# Patient Record
Sex: Female | Born: 1995 | Race: Asian | Hispanic: No | Marital: Single | Smoking: Never smoker
Health system: Southern US, Community
[De-identification: ages and names within clinical notes are randomized; demographics above are authoritative.]

## PROBLEM LIST (undated history)

## (undated) DIAGNOSIS — F32A Depression, unspecified: Secondary | ICD-10-CM

## (undated) DIAGNOSIS — K589 Irritable bowel syndrome without diarrhea: Secondary | ICD-10-CM

## (undated) DIAGNOSIS — F419 Anxiety disorder, unspecified: Secondary | ICD-10-CM

## (undated) HISTORY — PX: NO PAST SURGERIES: SHX2092

## (undated) HISTORY — PX: UPPER GI ENDOSCOPY: SHX6162

## (undated) HISTORY — DX: Depression, unspecified: F32.A

## (undated) HISTORY — PX: COLONOSCOPY: SHX174

## (undated) HISTORY — DX: Anxiety disorder, unspecified: F41.9

## (undated) HISTORY — DX: Irritable bowel syndrome, unspecified: K58.9

---

## 2020-10-11 ENCOUNTER — Encounter: Payer: Self-pay | Admitting: Medical-Surgical

## 2020-10-11 ENCOUNTER — Ambulatory Visit (INDEPENDENT_AMBULATORY_CARE_PROVIDER_SITE_OTHER): Payer: BC Managed Care – PPO | Admitting: Medical-Surgical

## 2020-10-11 ENCOUNTER — Other Ambulatory Visit: Payer: Self-pay

## 2020-10-11 VITALS — BP 116/74 | HR 76 | Temp 98.6°F | Ht 64.0 in | Wt 140.9 lb

## 2020-10-11 DIAGNOSIS — Z113 Encounter for screening for infections with a predominantly sexual mode of transmission: Secondary | ICD-10-CM

## 2020-10-11 DIAGNOSIS — Z1159 Encounter for screening for other viral diseases: Secondary | ICD-10-CM

## 2020-10-11 DIAGNOSIS — F418 Other specified anxiety disorders: Secondary | ICD-10-CM

## 2020-10-11 DIAGNOSIS — Z7689 Persons encountering health services in other specified circumstances: Secondary | ICD-10-CM | POA: Diagnosis not present

## 2020-10-11 DIAGNOSIS — Z114 Encounter for screening for human immunodeficiency virus [HIV]: Secondary | ICD-10-CM

## 2020-10-11 DIAGNOSIS — Z30011 Encounter for initial prescription of contraceptive pills: Secondary | ICD-10-CM

## 2020-10-11 LAB — POCT URINE PREGNANCY: Preg Test, Ur: NEGATIVE

## 2020-10-11 MED ORDER — NORETHINDRONE 0.35 MG PO TABS
1.0000 | ORAL_TABLET | Freq: Every day | ORAL | 11 refills | Status: DC
Start: 2020-10-11 — End: 2021-09-05

## 2020-10-11 NOTE — Progress Notes (Signed)
New Patient Office Visit  Subjective:  Patient ID: Heather Wolfe, female    DOB: 1996/02/01  Age: 25 y.o. MRN: [id number]  CC:  Chief Complaint  Patient presents with  . Establish Care     HPI Heather Wolfe presents to establish care.   Anxiety/Depression- previously treated but not able to remember what medications she took. Notes that there was 3 of them and thinks that was just too many at one time. Predominantly anxiety symptoms, notes triggers are usually small things but cannot provide specific examples. Not wanting to restart medications today but would like a referral to counseling as she felt this was useful in the past.   Birth control- currently sexually active with one female partner, using condoms. Would like to restart birth control but interested in COCs. 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 Has not had migraines in several years now and would like to go back on COCs, mainly for the beneficial action on her acne.   Requesting STI screening today. No known exposure but would like to be tested for peace of mind.   Past Medical History:  Diagnosis Date  . Anxiety   . Depression     Past Surgical History:  Procedure Laterality Date  . NO PAST SURGERIES      Family History  Problem Relation Age of Onset  . Diabetes Mother   . Hypertension Mother   . Breast cancer Paternal Aunt   . Diabetes Maternal Grandfather   . Diabetes Paternal Grandmother     Social History   Socioeconomic History  . Marital status: Single    Spouse name: Not on file  . Number of children: Not on file  . Years of education: Not on file  . Highest education level: Not on file  Occupational History  . Not on file  Tobacco Use  . Smoking status: Never Smoker  . Smokeless tobacco: Never Used  Vaping Use  . Vaping Use: Never used  Substance and Sexual Activity  . Alcohol use: Yes    Alcohol/week: 1.0 - 2.0 standard drink     Types: 1 - 2 Standard drinks or equivalent per week  . Drug use: Never  . Sexual activity: Yes    Partners: Male    Birth control/protection: Condom  Other Topics Concern  . Not on file  Social History Narrative  . Not on file   Social Determinants of Health   Financial Resource Strain: Not on file  Food Insecurity: Not on file  Transportation Needs: Not on file  Physical Activity: Not on file  Stress: Not on file  Social Connections: Not on file  Intimate Partner Violence: Not on file    ROS Review of Systems  Constitutional: Negative for chills, fatigue, fever and unexpected weight change.  Respiratory: Negative for cough, chest tightness, shortness of breath and wheezing.   Cardiovascular: Negative for chest pain, palpitations and leg swelling.  Genitourinary: Negative for dysuria, frequency and urgency.  Neurological: Negative for dizziness, light-headedness and headaches.  Psychiatric/Behavioral: Positive for dysphoric mood. Negative for self-injury and suicidal ideas. The patient is nervous/anxious.     Objective:   Today's Vitals: BP 116/74   Pulse 76   Temp 98.6 F (37 C)   Ht 5\' 4"  (1.626 m)   Wt 140 lb 14.4 oz (63.9 kg)   LMP 09/23/2020   BMI 24.19 kg/m   Physical Exam Vitals reviewed.  Constitutional:  General: She is not in acute distress.    Appearance: Normal appearance.  HENT:     Head: Normocephalic and atraumatic.  Cardiovascular:     Rate and Rhythm: Normal rate and regular rhythm.     Pulses: Normal pulses.     Heart sounds: Normal heart sounds. No murmur heard. No friction rub. No gallop.   Pulmonary:     Effort: Pulmonary effort is normal. No respiratory distress.     Breath sounds: Normal breath sounds. No wheezing.  Skin:    General: Skin is warm and dry.  Neurological:     Mental Status: She is alert and oriented to person, place, and time.  Psychiatric:        Mood and Affect: Mood normal.        Behavior: Behavior normal.         Thought Content: Thought content normal.        Judgment: Judgment normal.    Assessment & Plan:   1. Encounter to establish care Reviewed available information and discussed concerns with patient.   2. Anxiety with depression Discussed management of anxiety/depression. Advised to return for discussion if she would like to start a medication. We will avoid benzodiazepines but if she should warrant such treatment, will refer to psychiatry. Referring to behavioral health for counseling.  - Ambulatory referral to Behavioral Health  3. Routine screening for STI (sexually transmitted infection) Checking for STIs as below.  - RPR - Hepatitis B surface antigen - C. trachomatis/N. gonorrhoeae RNA  4. Screening for HIV (human immunodeficiency virus) Checking for HIV for routine STI screening.  - HIV Antibody (routine testing w rflx)  5. Need for hepatitis C screening test Checking for hepatitis C for routine STI screening.  - Hepatitis C antibody  6. Encounter for prescription of oral contraceptives POCT UPT negative. Discussed the risks of COCs in women with migraines with aura. Discussed progesterone only options. She would like to restart the mini pill. Sending in Micronor.  - POCT urine pregnancy  Outpatient Encounter Medications as of 10/11/2020  Medication Sig  . norethindrone (ORTHO MICRONOR) 0.35 MG tablet Take 1 tablet (0.35 mg total) by mouth daily.   No facility-administered encounter medications on file as of 10/11/2020.    Follow-up: Return in about 6 weeks (around 11/22/2020) for birth control follow up.   Thayer Ohm, DNP, APRN, FNP-BC [facility]

## 2020-10-11 NOTE — Patient Instructions (Signed)
Critical care medicine: Principles of diagnosis and management in the adult (4th ed., pp. 0350-0938). Saunders."> Miller's anesthesia (8th ed., pp. 232-250). Saunders.">  Advance Directive  Advance directives are legal documents that allow you to make decisions about your health care and medical treatment in case you become unable to communicate for yourself. Advance directives let your wishes be known to family, friends, and health care providers. Discussing and writing advance directives should happen over time rather than all at once. Advance directives can be changed and updated at any time. There are different types of advance directives, such as:  Medical power of attorney.  Living will.  Do not resuscitate (DNR) order or do not attempt resuscitation (DNAR) order. Health care proxy and medical power of attorney A health care proxy is also called a health care agent. This person is appointed to make medical decisions for you when you are unable to make decisions for yourself. Generally, people ask a trusted friend or family member to act as their proxy and represent their preferences. Make sure you have an agreement with your trusted person to act as your proxy. A proxy may have to make a medical decision on your behalf if your wishes are not known. A medical power of attorney, also called a durable power of attorney for health care, is a legal document that names your health care proxy. Depending on the laws in your state, the document may need to be:  Signed.  Notarized.  Dated.  Copied.  Witnessed.  Incorporated into your medical record. You may also want to appoint a trusted person to manage your money in the event you are unable to do so. This is called a durable power of attorney for finances. It is a separate legal document from the durable power of attorney for health care. You may choose your health care proxy or someone different to act as your agent in money matters. If you  do not appoint a proxy, or there is a concern that the proxy is not acting in your best interest, a court may appoint a guardian to act on your behalf. Living will A living will is a set of instructions that state your wishes about medical care when you cannot express them yourself. Health care providers should keep a copy of your living will in your medical record. You may want to give a copy to family members or friends. To alert caregivers in case of an emergency, you can place a card in your wallet to let them know that you have a living will and where they can find it. A living will is used if you become:  Terminally ill.  Disabled.  Unable to communicate or make decisions. The following decisions should be included in your living will:  To use or not to use life support equipment, such as dialysis machines and breathing machines (ventilators).  Whether you want a DNR or DNAR order. This tells health care providers not to use cardiopulmonary resuscitation (CPR) if breathing or heartbeat stops.  To use or not to use tube feeding.  To be given or not to be given food and fluids.  Whether you want comfort (palliative) care when the goal becomes comfort rather than a cure.  Whether you want to donate your organs and tissues. A living will does not give instructions for distributing your money and property if you should pass away. DNR or DNAR A DNR or DNAR order is a request not to have CPR in  the event that your heart stops beating or you stop breathing. If a DNR or DNAR order has not been made and shared, a health care provider will try to help any patient whose heart has stopped or who has stopped breathing. If you plan to have surgery, talk with your health care provider about how your DNR or DNAR order will be followed if problems occur. What if I do not have an advance directive? Some states assign family decision makers to act on your behalf if you do not have an advance directive.  Each state has its own laws about advance directives. You may want to check with your health care provider, attorney, or state representative about the laws in your state. Summary  Advance directives are legal documents that allow you to make decisions about your health care and medical treatment in case you become unable to communicate for yourself.  The process of discussing and writing advance directives should happen over time. You can change and update advance directives at any time.  Advance directives may include a medical power of attorney, a living will, and a DNR or DNAR order. This information is not intended to replace advice given to you by your health care provider. Make sure you discuss any questions you have with your health care provider. Document Revised: 06/18/2020 Document Reviewed: 06/18/2020 Elsevier Patient Education  2021 Elsevier Inc.   Critical care medicine: Principles of diagnosis and management in the adult (4th ed., pp. 9767-3419). Saunders."> Miller's anesthesia (8th ed., pp. 232-250). Saunders.">  Advance Directive  Advance directives are legal documents that allow you to make decisions about your health care and medical treatment in case you become unable to communicate for yourself. Advance directives let your wishes be known to family, friends, and health care providers. Discussing and writing advance directives should happen over time rather than all at once. Advance directives can be changed and updated at any time. There are different types of advance directives, such as:  Medical power of attorney.  Living will.  Do not resuscitate (DNR) order or do not attempt resuscitation (DNAR) order. Health care proxy and medical power of attorney A health care proxy is also called a health care agent. This person is appointed to make medical decisions for you when you are unable to make decisions for yourself. Generally, people ask a trusted friend or family  member to act as their proxy and represent their preferences. Make sure you have an agreement with your trusted person to act as your proxy. A proxy may have to make a medical decision on your behalf if your wishes are not known. A medical power of attorney, also called a durable power of attorney for health care, is a legal document that names your health care proxy. Depending on the laws in your state, the document may need to be:  Signed.  Notarized.  Dated.  Copied.  Witnessed.  Incorporated into your medical record. You may also want to appoint a trusted person to manage your money in the event you are unable to do so. This is called a durable power of attorney for finances. It is a separate legal document from the durable power of attorney for health care. You may choose your health care proxy or someone different to act as your agent in money matters. If you do not appoint a proxy, or there is a concern that the proxy is not acting in your best interest, a court may appoint a guardian to  act on your behalf. Living will A living will is a set of instructions that state your wishes about medical care when you cannot express them yourself. Health care providers should keep a copy of your living will in your medical record. You may want to give a copy to family members or friends. To alert caregivers in case of an emergency, you can place a card in your wallet to let them know that you have a living will and where they can find it. A living will is used if you become:  Terminally ill.  Disabled.  Unable to communicate or make decisions. The following decisions should be included in your living will:  To use or not to use life support equipment, such as dialysis machines and breathing machines (ventilators).  Whether you want a DNR or DNAR order. This tells health care providers not to use cardiopulmonary resuscitation (CPR) if breathing or heartbeat stops.  To use or not to use tube  feeding.  To be given or not to be given food and fluids.  Whether you want comfort (palliative) care when the goal becomes comfort rather than a cure.  Whether you want to donate your organs and tissues. A living will does not give instructions for distributing your money and property if you should pass away. DNR or DNAR A DNR or DNAR order is a request not to have CPR in the event that your heart stops beating or you stop breathing. If a DNR or DNAR order has not been made and shared, a health care provider will try to help any patient whose heart has stopped or who has stopped breathing. If you plan to have surgery, talk with your health care provider about how your DNR or DNAR order will be followed if problems occur. What if I do not have an advance directive? Some states assign family decision makers to act on your behalf if you do not have an advance directive. Each state has its own laws about advance directives. You may want to check with your health care provider, attorney, or state representative about the laws in your state. Summary  Advance directives are legal documents that allow you to make decisions about your health care and medical treatment in case you become unable to communicate for yourself.  The process of discussing and writing advance directives should happen over time. You can change and update advance directives at any time.  Advance directives may include a medical power of attorney, a living will, and a DNR or DNAR order. This information is not intended to replace advice given to you by your health care provider. Make sure you discuss any questions you have with your health care provider. Document Revised: 06/18/2020 Document Reviewed: 06/18/2020 Elsevier Patient Education  2021 [employer].

## 2020-10-12 LAB — C. TRACHOMATIS/N. GONORRHOEAE RNA
C. trachomatis RNA, TMA: NOT DETECTED
N. gonorrhoeae RNA, TMA: NOT DETECTED

## 2020-10-15 LAB — HEPATITIS C ANTIBODY
Hepatitis C Ab: NONREACTIVE
SIGNAL TO CUT-OFF: 0.01 (ref <1.00)

## 2020-10-15 LAB — HEPATITIS B SURFACE ANTIGEN: Hepatitis B Surface Ag: NONREACTIVE

## 2020-10-15 LAB — RPR: RPR Ser Ql: NONREACTIVE

## 2020-10-15 LAB — HIV ANTIBODY (ROUTINE TESTING W REFLEX): HIV 1&2 Ab, 4th Generation: NONREACTIVE

## 2020-10-18 ENCOUNTER — Ambulatory Visit (INDEPENDENT_AMBULATORY_CARE_PROVIDER_SITE_OTHER): Payer: BC Managed Care – PPO | Admitting: Psychology

## 2020-10-18 DIAGNOSIS — F411 Generalized anxiety disorder: Secondary | ICD-10-CM | POA: Diagnosis not present

## 2020-10-31 ENCOUNTER — Ambulatory Visit: Payer: BC Managed Care – PPO | Admitting: Psychology

## 2020-11-15 ENCOUNTER — Ambulatory Visit (INDEPENDENT_AMBULATORY_CARE_PROVIDER_SITE_OTHER): Payer: BC Managed Care – PPO | Admitting: Psychology

## 2020-11-15 DIAGNOSIS — F411 Generalized anxiety disorder: Secondary | ICD-10-CM

## 2020-11-22 ENCOUNTER — Encounter: Payer: Self-pay | Admitting: Medical-Surgical

## 2020-11-22 ENCOUNTER — Telehealth (INDEPENDENT_AMBULATORY_CARE_PROVIDER_SITE_OTHER): Payer: BC Managed Care – PPO | Admitting: Medical-Surgical

## 2020-11-22 DIAGNOSIS — Z789 Other specified health status: Secondary | ICD-10-CM | POA: Diagnosis not present

## 2020-11-22 DIAGNOSIS — Z30011 Encounter for initial prescription of contraceptive pills: Secondary | ICD-10-CM

## 2020-11-22 NOTE — Progress Notes (Signed)
Virtual Visit via Video Note  I connected with Heather Wolfe on 11/22/20 at  8:30 AM EST by a video enabled telemedicine application and verified that I am speaking with the correct person using two identifiers.   I discussed the limitations of evaluation and management by telemedicine and the availability of in person appointments. The patient expressed understanding and agreed to proceed.  Patient location: home Provider locations: office  Subjective:    CC: birth control follow up  HPI: Pleasant 25 year old female presenting via MyChart video visit for birth control follow up after starting Micronor approximately 6 weeks ago. She is taking the pill daily at the same time each day. She has had some spotting and her period started a little early. She denies severe period symptoms. Notes the pills is not helping with her headaches but feels this is okay since she only has 1-2 per week and they are not severe. She would like to continue on the Micronor since combined contraceptives are contraindicated with her history.   Past medical history, Surgical history, Family history not pertinant except as noted below, Social history, Allergies, and medications have been entered into the medical record, reviewed, and corrections made.   Review of Systems: See HPI for pertinent positives and negatives.   Objective:    General: Speaking clearly in complete sentences without any shortness of breath.  Alert and oriented x3.  Normal judgment. No apparent acute distress.  Impression and Recommendations:    1. Encounter for prescription of oral contraceptives Doing well on Micronor. Will continue on this. 1 year supply sent to pharmacy.   I discussed the assessment and treatment plan with the patient. The patient was provided an opportunity to ask questions and all were answered. The patient agreed with the plan and demonstrated an understanding of the instructions.   The patient was advised to  call back or seek an in-person evaluation if the symptoms worsen or if the condition fails to improve as anticipated.  15 minutes of non-face-to-face time was provided during this encounter.  Return in about 1 year (around 11/22/2021) for birth control follow up or sooner if needed.  Thayer Ohm, DNP, APRN, FNP-BC [facility]

## 2020-11-28 ENCOUNTER — Ambulatory Visit (INDEPENDENT_AMBULATORY_CARE_PROVIDER_SITE_OTHER): Payer: BC Managed Care – PPO | Admitting: Psychology

## 2020-11-28 DIAGNOSIS — F411 Generalized anxiety disorder: Secondary | ICD-10-CM

## 2020-12-16 ENCOUNTER — Ambulatory Visit (INDEPENDENT_AMBULATORY_CARE_PROVIDER_SITE_OTHER): Payer: BC Managed Care – PPO | Admitting: Psychology

## 2020-12-16 DIAGNOSIS — F411 Generalized anxiety disorder: Secondary | ICD-10-CM

## 2021-01-06 ENCOUNTER — Ambulatory Visit (INDEPENDENT_AMBULATORY_CARE_PROVIDER_SITE_OTHER): Payer: BC Managed Care – PPO | Admitting: Psychology

## 2021-01-06 DIAGNOSIS — F411 Generalized anxiety disorder: Secondary | ICD-10-CM | POA: Diagnosis not present

## 2021-01-27 ENCOUNTER — Ambulatory Visit: Payer: BC Managed Care – PPO | Admitting: Psychology

## 2021-02-10 ENCOUNTER — Ambulatory Visit (INDEPENDENT_AMBULATORY_CARE_PROVIDER_SITE_OTHER): Payer: BC Managed Care – PPO | Admitting: Psychology

## 2021-02-10 DIAGNOSIS — F411 Generalized anxiety disorder: Secondary | ICD-10-CM

## 2021-03-07 ENCOUNTER — Ambulatory Visit: Payer: Self-pay | Admitting: Psychology

## 2021-03-07 ENCOUNTER — Ambulatory Visit (INDEPENDENT_AMBULATORY_CARE_PROVIDER_SITE_OTHER): Payer: BC Managed Care – PPO | Admitting: Psychology

## 2021-03-07 DIAGNOSIS — F411 Generalized anxiety disorder: Secondary | ICD-10-CM | POA: Diagnosis not present

## 2021-03-15 ENCOUNTER — Encounter: Payer: Self-pay | Admitting: Medical-Surgical

## 2021-03-24 ENCOUNTER — Ambulatory Visit (INDEPENDENT_AMBULATORY_CARE_PROVIDER_SITE_OTHER): Payer: BC Managed Care – PPO | Admitting: Psychology

## 2021-03-24 DIAGNOSIS — F411 Generalized anxiety disorder: Secondary | ICD-10-CM

## 2021-04-08 ENCOUNTER — Ambulatory Visit (INDEPENDENT_AMBULATORY_CARE_PROVIDER_SITE_OTHER): Payer: BC Managed Care – PPO | Admitting: Psychology

## 2021-04-08 DIAGNOSIS — F411 Generalized anxiety disorder: Secondary | ICD-10-CM

## 2021-04-28 ENCOUNTER — Ambulatory Visit (INDEPENDENT_AMBULATORY_CARE_PROVIDER_SITE_OTHER): Payer: BC Managed Care – PPO | Admitting: Psychology

## 2021-04-28 DIAGNOSIS — F411 Generalized anxiety disorder: Secondary | ICD-10-CM

## 2021-05-13 ENCOUNTER — Ambulatory Visit (INDEPENDENT_AMBULATORY_CARE_PROVIDER_SITE_OTHER): Payer: BC Managed Care – PPO | Admitting: Psychology

## 2021-05-13 DIAGNOSIS — F411 Generalized anxiety disorder: Secondary | ICD-10-CM

## 2021-05-16 ENCOUNTER — Encounter: Payer: Self-pay | Admitting: Family Medicine

## 2021-05-16 ENCOUNTER — Ambulatory Visit (INDEPENDENT_AMBULATORY_CARE_PROVIDER_SITE_OTHER): Payer: BC Managed Care – PPO | Admitting: Family Medicine

## 2021-05-16 ENCOUNTER — Other Ambulatory Visit: Payer: Self-pay

## 2021-05-16 VITALS — BP 125/86 | HR 70 | Temp 98.1°F | Resp 16

## 2021-05-16 DIAGNOSIS — R059 Cough, unspecified: Secondary | ICD-10-CM

## 2021-05-16 MED ORDER — HYDROCOD POLST-CPM POLST ER 10-8 MG/5ML PO SUER: 5.0000 mL | Freq: Two times a day (BID) | ORAL | 0 refills | Status: AC | PRN

## 2021-05-16 NOTE — Patient Instructions (Addendum)
Zyrtec at bedtime Flonase nasal spray Mucinex twice daily Warm liquids with honey and lemon Tylenol, ibuprofen, OTC cough/cold medicines Prescription cough medicine for bedtime use   For birth control information: [email or web address]

## 2021-05-16 NOTE — Progress Notes (Signed)
Acute Office Visit  Subjective:    Patient ID: Heather Wolfe, female    DOB: 06/13/1996, 25 y.o.   MRN: [id number]  Chief Complaint  Patient presents with   Cough     HPI Patient is in today for cough.  Patient states she was exposed to COVID 3 weeks ago and ever since has had sore throat, cough, post nasal drip, mild ear pressure. States the sore throat has improved over the past few days, but the cough is her most bothersome symptoms. States it is productive at times, but always clear sputum. She reports a fever early in course of symptoms, but none recently. She had 5 negative COVID tests over the past few weeks. The prolonged coughing is her most concerning symptom, because she reports having pneumonia in the past (this makes her nervous). She denies any sinus pressure, rashes, nasal congestion, chest pain, shortness of breath, wheezing. States she did have some mild bilateral ear fullness but thought it was related to flying recently; also reporting some mild fatigue and body aches early in the course of symptoms. Has had occasional headaches, but reports a baseline of occasional migraines. Reports she does have seasonal allergies. She was taking dayquil for awhile for her symptoms. Daytime cough is manageable with OTC options, but night time cough is keeping her from sleeping.     Past Medical History:  Diagnosis Date   Anxiety    Depression     Past Surgical History:  Procedure Laterality Date   NO PAST SURGERIES      Family History  Problem Relation Age of Onset   Diabetes Mother    Hypertension Mother    Breast cancer Paternal Aunt    Diabetes Maternal Grandfather    Diabetes Paternal Grandmother     Social History   Socioeconomic History   Marital status: Single    Spouse name: Not on file   Number of children: Not on file   Years of education: Not on file   Highest education level: Not on file  Occupational History   Not on file  Tobacco Use    Smoking status: Never   Smokeless tobacco: Never  Vaping Use   Vaping Use: Never used  Substance and Sexual Activity   Alcohol use: Yes    Alcohol/week: 1.0 - 2.0 standard drink    Types: 1 - 2 Standard drinks or equivalent per week   Drug use: Never   Sexual activity: Yes    Partners: Male    Birth control/protection: Condom  Other Topics Concern   Not on file  Social History Narrative   Not on file   Social Determinants of Health   Financial Resource Strain: Not on file  Food Insecurity: Not on file  Transportation Needs: Not on file  Physical Activity: Not on file  Stress: Not on file  Social Connections: Not on file  Intimate Partner Violence: Not on file    Outpatient Medications Prior to Visit  Medication Sig Dispense Refill   norethindrone (ORTHO MICRONOR) 0.35 MG tablet Take 1 tablet (0.35 mg total) by mouth daily. 28 tablet 11   No facility-administered medications prior to visit.    Allergies  Allergen Reactions   Bactrim [Sulfamethoxazole-Trimethoprim] Hives and Other (See Comments)    Fever, chills   Sulfa Antibiotics Hives    Fever, chills    Review of Systems All review of systems negative except what is listed in the HPI     Objective:  Physical Exam Vitals reviewed.  Constitutional:      Appearance: Normal appearance.  HENT:     Head: Normocephalic and atraumatic.     Ears:     Comments: Mild bilateral middle ear effusions    Nose: Congestion present.     Mouth/Throat:     Mouth: Mucous membranes are moist.     Pharynx: Oropharynx is clear.     Tonsils: 0 on the right. 0 on the left.  Cardiovascular:     Rate and Rhythm: Normal rate and regular rhythm.     Heart sounds: Normal heart sounds.  Pulmonary:     Effort: Pulmonary effort is normal.     Breath sounds: Normal breath sounds. No wheezing, rhonchi or rales.  Abdominal:     Palpations: Abdomen is soft.  Musculoskeletal:     Cervical back: Normal range of motion and neck  supple.  Lymphadenopathy:     Cervical: No cervical adenopathy.  Skin:    General: Skin is warm and dry.     Findings: No rash.  Neurological:     Mental Status: She is alert and oriented to person, place, and time. Mental status is at baseline.  Psychiatric:        Mood and Affect: Mood normal.        Behavior: Behavior normal.        Thought Content: Thought content normal.        Judgment: Judgment normal.    BP 125/86   Pulse 70   Temp 98.1 F (36.7 C)   Resp 16   SpO2 98%  Wt Readings from Last 3 Encounters:  10/11/20 140 lb 14.4 oz (63.9 kg)    Health Maintenance Due  Topic Date Due   PAP SMEAR-Modifier  Never done   COVID-19 Vaccine (3 - Booster for Moderna series) 06/16/2020   INFLUENZA VACCINE  04/28/2021    There are no preventive care reminders to display for this patient.   No results found for: TSH No results found for: WBC, HGB, HCT, MCV, PLT No results found for: NA, K, CHLORIDE, CO2, GLUCOSE, BUN, CREATININE, BILITOT, ALKPHOS, AST, ALT, PROT, ALBUMIN, CALCIUM, ANIONGAP, EGFR, GFR No results found for: CHOL No results found for: HDL No results found for: LDLCALC No results found for: TRIG No results found for: CHOLHDL No results found for: HGBA1C     Assessment & Plan:   1. Cough Educated patient on viral vs bacterial infections. Given that she has noted some improvement this week, will hold of on antibiotics a little bit longer. Giving some stronger cough syrup and encouraged her to reserve this for bedtime use. Discussed pulmonary hygiene, OTC cough/cold meds, allergy medications, Flonase, rest, hydration, humidifier, warm liquids with honey and lemon, etc. If not feeling better or if symptoms worsen, can consider ABX.    - chlorpheniramine-HYDROcodone ([place]) 10-8 MG/5ML SUER; Take 5 mLs by mouth every 12 (twelve) hours as needed for up to 5 days for cough (cough, will cause drowsiness.).  Dispense: 50 mL; Refill: 0   She briefly mentioned  not being very pleased with her birth control in relation to irregular bleeding. Discussed her progesterone-only options given migraines and she said she would think about it and let PCP know if she decides to make any changes.   Patient aware of signs/symptoms requiring further/urgent evaluation.  Follow-up as needed, if symptoms worsen or fail to improve.   Purcell Nails Olevia Bowens, DNP, FNP-C

## 2021-05-26 ENCOUNTER — Ambulatory Visit (INDEPENDENT_AMBULATORY_CARE_PROVIDER_SITE_OTHER): Payer: BC Managed Care – PPO | Admitting: Psychology

## 2021-05-26 DIAGNOSIS — F411 Generalized anxiety disorder: Secondary | ICD-10-CM | POA: Diagnosis not present

## 2021-05-27 ENCOUNTER — Ambulatory Visit: Payer: BC Managed Care – PPO | Admitting: Psychology

## 2021-06-10 ENCOUNTER — Ambulatory Visit (INDEPENDENT_AMBULATORY_CARE_PROVIDER_SITE_OTHER): Payer: BC Managed Care – PPO | Admitting: Psychology

## 2021-06-10 DIAGNOSIS — F411 Generalized anxiety disorder: Secondary | ICD-10-CM | POA: Diagnosis not present

## 2021-06-24 ENCOUNTER — Ambulatory Visit (INDEPENDENT_AMBULATORY_CARE_PROVIDER_SITE_OTHER): Payer: BC Managed Care – PPO | Admitting: Psychology

## 2021-06-24 DIAGNOSIS — F411 Generalized anxiety disorder: Secondary | ICD-10-CM | POA: Diagnosis not present

## 2021-07-09 ENCOUNTER — Ambulatory Visit (INDEPENDENT_AMBULATORY_CARE_PROVIDER_SITE_OTHER): Payer: BC Managed Care – PPO | Admitting: Psychology

## 2021-07-09 DIAGNOSIS — F411 Generalized anxiety disorder: Secondary | ICD-10-CM | POA: Diagnosis not present

## 2021-07-22 ENCOUNTER — Ambulatory Visit (INDEPENDENT_AMBULATORY_CARE_PROVIDER_SITE_OTHER): Payer: BC Managed Care – PPO | Admitting: Psychology

## 2021-07-22 DIAGNOSIS — F411 Generalized anxiety disorder: Secondary | ICD-10-CM | POA: Diagnosis not present

## 2021-08-04 ENCOUNTER — Ambulatory Visit (INDEPENDENT_AMBULATORY_CARE_PROVIDER_SITE_OTHER): Payer: BC Managed Care – PPO | Admitting: Psychology

## 2021-08-04 DIAGNOSIS — F411 Generalized anxiety disorder: Secondary | ICD-10-CM | POA: Diagnosis not present

## 2021-08-18 ENCOUNTER — Ambulatory Visit (INDEPENDENT_AMBULATORY_CARE_PROVIDER_SITE_OTHER): Payer: BC Managed Care – PPO | Admitting: Psychology

## 2021-08-18 DIAGNOSIS — F411 Generalized anxiety disorder: Secondary | ICD-10-CM | POA: Diagnosis not present

## 2021-09-01 ENCOUNTER — Ambulatory Visit (INDEPENDENT_AMBULATORY_CARE_PROVIDER_SITE_OTHER): Payer: BC Managed Care – PPO | Admitting: Psychology

## 2021-09-01 DIAGNOSIS — F411 Generalized anxiety disorder: Secondary | ICD-10-CM | POA: Diagnosis not present

## 2021-09-05 ENCOUNTER — Other Ambulatory Visit: Payer: Self-pay

## 2021-09-05 DIAGNOSIS — Z789 Other specified health status: Secondary | ICD-10-CM

## 2021-09-05 MED ORDER — NORETHINDRONE 0.35 MG PO TABS: 1.0000 | ORAL_TABLET | Freq: Every day | ORAL | 3 refills | Status: DC

## 2021-09-07 ENCOUNTER — Encounter: Payer: Self-pay | Admitting: Medical-Surgical

## 2021-09-08 ENCOUNTER — Other Ambulatory Visit: Payer: Self-pay

## 2021-09-08 DIAGNOSIS — Z789 Other specified health status: Secondary | ICD-10-CM

## 2021-09-08 MED ORDER — NORETHINDRONE 0.35 MG PO TABS: 1.0000 | ORAL_TABLET | Freq: Every day | ORAL | 3 refills | Status: DC

## 2021-09-15 ENCOUNTER — Ambulatory Visit (INDEPENDENT_AMBULATORY_CARE_PROVIDER_SITE_OTHER): Payer: BC Managed Care – PPO | Admitting: Psychology

## 2021-09-15 DIAGNOSIS — F411 Generalized anxiety disorder: Secondary | ICD-10-CM
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
## 2021-09-21 ENCOUNTER — Encounter: Payer: Self-pay | Admitting: Medical-Surgical

## 2021-09-30 ENCOUNTER — Ambulatory Visit (INDEPENDENT_AMBULATORY_CARE_PROVIDER_SITE_OTHER): Payer: BC Managed Care – PPO | Admitting: Psychology

## 2021-09-30 DIAGNOSIS — F411 Generalized anxiety disorder: Secondary | ICD-10-CM
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
## 2021-10-13 ENCOUNTER — Ambulatory Visit (INDEPENDENT_AMBULATORY_CARE_PROVIDER_SITE_OTHER): Payer: BC Managed Care – PPO | Admitting: Psychology

## 2021-10-13 DIAGNOSIS — F411 Generalized anxiety disorder: Secondary | ICD-10-CM | POA: Diagnosis not present

## 2021-10-13 NOTE — Progress Notes (Signed)
[facility] Counselor/Therapist Progress Note  Patient ID: Heather Wolfe, MRN: [id number],    Date: 10/13/2021  Time Spent: 11:00am-12:00pm   60 minutes   Treatment Type: Individual Therapy  Reported Symptoms: stress, anxiety  Mental Status Exam: Appearance:  Casual     Behavior: Appropriate  Motor: Normal  Speech/Language:  Normal Rate  Affect: Appropriate  Mood: normal  Thought process: normal  Thought content:   WNL  Sensory/Perceptual disturbances:   WNL  Orientation: oriented to person, place, time/date, and situation  Attention: Good  Concentration: Good  Memory: WNL  Fund of knowledge:  Good  Insight:   Good  Judgment:  Good  Impulse Control: Good   Risk Assessment: Danger to Self:  No Self-injurious Behavior: No Danger to Others: No Duty to Warn:no Physical Aggression / Violence:No  Access to Firearms a concern: No  Gang Involvement:No   Subjective: Pt present for face-to-face individual therapy via Webex.   Pt consents to telehealth video therapy due to COVID 19 pandemic. Location of pt: home. Location of therapist: home office.   Pt talked about work.   She states it has been a very busy Monday.  They made an announcement that the company is closing the [place] location.  This change will increase her job responsibilities.  Pt states this is good for job security but will be more stressful.  Worked on Optician, dispensing.  Pt states she has a lot of anxiety about work.  She has a fear of failure.  She doesn't want to let people down.  Addressed pt's worry thoughts and worked on thought reframing. Pt states that [name] school is going well.  She likes her courses this semester.  Pt talked about her relationship with [name].   She states things have been better between them.   They have had some conversations that have been helpful.   Pt states [name] is very serious about her and thinks about moving in together and marriage.  Pt still gets  insecure about the relationship. Helped pt process her feelings and relationship dynamics.     Provided supportive therapy.     Interventions: Cognitive Behavioral Therapy and Insight-Oriented  Diagnosis:  F41.1  Plan: See pt's Treatment Plan for anxiety in Therapy Charts.  (Treatment Plan Target Date: 10/18/2021) Pt is progressing toward treatment goals.   Plan to continue to see pt every two weeks.    Xochil Shanker, LCSW                  Kimberlie Csaszar Benson, LCSW

## 2021-10-14 ENCOUNTER — Other Ambulatory Visit: Payer: Self-pay

## 2021-10-14 ENCOUNTER — Ambulatory Visit (INDEPENDENT_AMBULATORY_CARE_PROVIDER_SITE_OTHER): Payer: BC Managed Care – PPO | Admitting: Obstetrics and Gynecology

## 2021-10-14 ENCOUNTER — Encounter: Payer: Self-pay | Admitting: Obstetrics and Gynecology

## 2021-10-14 VITALS — BP 120/73 | HR 72 | Ht 64.0 in | Wt 142.0 lb

## 2021-10-14 DIAGNOSIS — N63 Unspecified lump in unspecified breast: Secondary | ICD-10-CM | POA: Diagnosis not present

## 2021-10-14 NOTE — Progress Notes (Signed)
° ° °  GYNECOLOGY ENCOUNTER NOTE  History:     Heather Wolfe is a 26 y.o. here with bilateral breast tenderness with a right breast nodule.  Tenderness has been present for a few weeks. She has no nipple changes or nipple discharge. She has not tried anything OTC for the tenderness. Paternal Aunt passed away from Breast CA.  She is on oral progestin only pills. However is interested in trying another form of progestin as her current method causes abnormal spotting. She has a history of migraines and cannot  use estrogen.     Gynecologic History Patient's last menstrual period was 09/28/2021. Contraception: oral progesterone-only contraceptive   Obstetric History OB History  No obstetric history on file.    Past Medical History:  Diagnosis Date   Anxiety    Depression     Past Surgical History:  Procedure Laterality Date   NO PAST SURGERIES      Current Outpatient Medications on File Prior to Visit  Medication Sig Dispense Refill   norethindrone (ORTHO MICRONOR) 0.35 MG tablet Take 1 tablet (0.35 mg total) by mouth daily. 28 tablet 3   No current facility-administered medications on file prior to visit.    Allergies  Allergen Reactions   Sulfa Antibiotics Hives    Fever, chills   Sulfamethoxazole-Trimethoprim Hives, Other (See Comments) and Rash    Fever, chills    Social History:  reports that she has never smoked. She has never used smokeless tobacco. She reports current alcohol use of about 1.0 - 2.0 standard drink per week. She reports that she does not use drugs.  Family History  Problem Relation Age of Onset   Diabetes Mother    Hypertension Mother    Breast cancer Paternal Aunt    Diabetes Maternal Grandfather    Diabetes Paternal Grandmother     The following portions of the patient's history were reviewed and updated as appropriate: allergies, current medications, past family history, past medical history, past social history, past surgical history and  problem list.  Review of Systems Pertinent items noted in HPI and remainder of comprehensive ROS otherwise negative.  Physical Exam:  BP 120/73    Pulse 72    Ht 5\' 4"  (1.626 m)    Wt 142 lb (64.4 kg)    LMP 09/28/2021    BMI 24.37 kg/m  CONSTITUTIONAL: Well-developed, well-nourished female in no acute distress.  HENT:  Normocephalic EYES: Conjunctivae and EOM are normal. NECK: Normal range of motion, supple, no masses.  Normal thyroid.  SKIN: Skin is warm and dry.  MUSCULOSKELETAL: Normal range of motion NEUROLOGIC: Alert and oriented to person, place, and time. PSYCHIATRIC: Normal mood and affect.  BREASTS: Symmetric in size. Right palpable mobile mass at 7 o'clock of right breast. Tender to touch. 4 -5 mm in size. No  skin changes, nipple drainage, or lymphadenopathy bilaterally. Performed in the presence of a chaperone. PELVIC: Deferred.    Assessment and Plan:   1. Breast lump in female  - Slynd samples provider to patient. She will schedule f/u with annual and if she prefers to continue Slynd I will RX. She is due for an annual.  - 11/26/2021 BREAST LTD UNI RIGHT INC AXILLA; Future   Numan Zylstra, [place], NP [facility], [facility]

## 2021-10-28 ENCOUNTER — Ambulatory Visit (INDEPENDENT_AMBULATORY_CARE_PROVIDER_SITE_OTHER): Payer: BC Managed Care – PPO | Admitting: Psychology

## 2021-10-28 DIAGNOSIS — F411 Generalized anxiety disorder: Secondary | ICD-10-CM

## 2021-10-28 NOTE — Progress Notes (Signed)
[facility] Counselor/Therapist Progress Note  Patient ID: Heather Wolfe, MRN: [id number],    Date: 10/28/2021  Time Spent: 11:00am-12:00pm   60 minutes   Treatment Type: Individual Therapy  Reported Symptoms: stress, anxiety  Mental Status Exam: Appearance:  Casual     Behavior: Appropriate  Motor: Normal  Speech/Language:  Normal Rate  Affect: Appropriate  Mood: normal  Thought process: normal  Thought content:   WNL  Sensory/Perceptual disturbances:   WNL  Orientation: oriented to person, place, time/date, and situation  Attention: Good  Concentration: Good  Memory: WNL  Fund of knowledge:  Good  Insight:   Good  Judgment:  Good  Impulse Control: Good   Risk Assessment: Danger to Self:  No Self-injurious Behavior: No Danger to Others: No Duty to Warn:no Physical Aggression / Violence:No  Access to Firearms a concern: No  Gang Involvement:No   Subjective: Pt present for face-to-face individual therapy via Webex.   Pt consents to telehealth video therapy due to COVID 19 pandemic. Location of pt: home. Location of therapist: home office.   Pt talked about work.  She had to talk to an employee who is not performing well.   She had to report the employee to HR and the employee lied so no action was taken.   This was very stressful for her.    Pt feels like her integrity was questioned.   Pt was tearful as she talked about feeling disenfranchised at work.   Addressed the incident and helped pt process her feelings and problem solve the next steps to take.   Pt talked about her relationship with [name].  They have made an effort to spend more time together.  Pt has spent some time with his family and it went well.  Pt feels supported by [name] regarding the stress at work.   Helped pt process her feelings and relationship dynamics.     Provided supportive therapy.   Reviewed pt's treatment plan for annual update.  Pt participated in setting treatment  goals.  Pt wants to continue to work on improving coping skills and have a safe place to talk about relationship issues.   Plan to continue to meet every two weeks.  Interventions: Cognitive Behavioral Therapy and Insight-Oriented  Diagnosis:  F41.1  Plan:  Reviewed pt's treatment plan for annual update.  Pt participated in setting treatment goals.  Pt wants to continue to work on improving coping skills and have a safe place to talk about relationship issues.   Plan to continue to meet every two weeks.    Treatment Plan   (Treatment Plan target date:  10/28/2022) Client Abilities/Strengths  Pt is bright, engaging and motivated for therapy.  Client Treatment Preferences  Individual therapy.  Client Statement of Needs  Improve coping skills.  Symptoms  Autonomic hyperactivity (e.g., palpitations, shortness of breath, dry mouth, trouble swallowing, nausea, diarrhea). Excessive and/or unrealistic worry that is difficult to control occurring more days than not for at least 6 months about a number of events or activities. Hypervigilance (e.g., feeling constantly on edge, experiencing concentration difficulties, having trouble falling or staying asleep, exhibiting a general state of irritability). Motor tension (e.g., restlessness, tiredness, shakiness, muscle tension). Problems Addressed  Anxiety Goals 1. Enhance ability to effectively cope with the full variety of life's worries and anxieties. 2. Learn and implement coping skills that result in a reduction of anxiety and worry, and improved daily functioning. Objective Learn to accept limitations in life and commit to tolerating,  rather than avoiding, unpleasant emotions while accomplishing meaningful goals. Target Date: 2022-10-28 Frequency: Biweekly Progress: 30 Modality: individual Related Interventions 1. Use techniques from Acceptance and Commitment Therapy to help client accept uncomfortable realities such as lack of complete control,  imperfections, and uncertainty and tolerate unpleasant emotions and thoughts in order to accomplish value-consistent goals. Objective Learn and implement problem-solving strategies for realistically addressing worries. Target Date: 2022-10-28 Frequency: Biweekly Progress: 30 Modality: individual Related Interventions 1. Assign the client a homework exercise in which he/she problem-solves a current problem.  review, reinforce success, and provide corrective feedback toward improvement. 2. Teach the client problem-solving strategies involving specifically defining a problem, generating options for addressing it, evaluating the pros and cons of each option, selecting and implementing an optional action, and reevaluating and refining the action. Objective Learn and implement calming skills to reduce overall anxiety and manage anxiety symptoms. Target Date: 2022-10-28 Frequency: Biweekly Progress: 30 Modality: individual Related Interventions 1. Assign the client to read about progressive muscle relaxation and other calming strategies in relevant books or treatment manuals (e.g., Progressive Relaxation Training by Robb Matar and Alen Blew; Mastery of Your Anxiety and Worry: Workbook by Earlie Counts). 2. Assign the client homework each session in which he/she practices relaxation exercises daily, gradually applying them progressively from non-anxiety-provoking to anxiety-provoking situations; review and reinforce success while providing corrective feedback toward improvement. 3. Teach the client calming/relaxation skills (e.g., applied relaxation, progressive muscle relaxation, cue controlled relaxation; mindful breathing; biofeedback) and how to discriminate better between relaxation and tension; teach the client how to apply these skills to his/her daily life. 3. Reduce overall frequency, intensity, and duration of the anxiety so that daily functioning is not impaired. 4. Resolve the core conflict  that is the source of anxiety. 5. Stabilize anxiety level while increasing ability to function on a daily basis. Diagnosis :    F41.1  Generalized Anxiety Disorder  Conditions For Discharge Achievement of treatment goals and objectives.   Bill Yohn, LCSW

## 2021-11-10 ENCOUNTER — Ambulatory Visit (INDEPENDENT_AMBULATORY_CARE_PROVIDER_SITE_OTHER): Payer: BC Managed Care – PPO | Admitting: Psychology

## 2021-11-10 DIAGNOSIS — F411 Generalized anxiety disorder: Secondary | ICD-10-CM
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
## 2021-11-11 ENCOUNTER — Ambulatory Visit
Admission: RE | Admit: 2021-11-11 | Discharge: 2021-11-11 | Disposition: A | Discharge status: Home / Self Care | Payer: BC Managed Care – PPO | Source: Ambulatory Visit | Attending: Obstetrics and Gynecology | Admitting: Obstetrics and Gynecology

## 2021-11-11 ENCOUNTER — Other Ambulatory Visit: Payer: Self-pay

## 2021-11-11 DIAGNOSIS — N63 Unspecified lump in unspecified breast: Secondary | ICD-10-CM

## 2021-11-11 DIAGNOSIS — N6311 Unspecified lump in the right breast, upper outer quadrant: Secondary | ICD-10-CM | POA: Diagnosis not present

## 2021-11-13 ENCOUNTER — Encounter: Payer: BC Managed Care – PPO | Admitting: Obstetrics & Gynecology

## 2021-11-21 ENCOUNTER — Other Ambulatory Visit (HOSPITAL_COMMUNITY)
Admission: RE | Admit: 2021-11-21 | Discharge: 2021-11-21 | Disposition: A | Discharge status: Home / Self Care | Payer: BC Managed Care – PPO | Source: Ambulatory Visit | Attending: Obstetrics and Gynecology | Admitting: Obstetrics and Gynecology

## 2021-11-21 ENCOUNTER — Other Ambulatory Visit: Payer: Self-pay

## 2021-11-21 ENCOUNTER — Ambulatory Visit (INDEPENDENT_AMBULATORY_CARE_PROVIDER_SITE_OTHER): Payer: BC Managed Care – PPO | Admitting: Obstetrics and Gynecology

## 2021-11-21 ENCOUNTER — Encounter: Payer: Self-pay | Admitting: Obstetrics and Gynecology

## 2021-11-21 VITALS — BP 114/72 | HR 73 | Ht 64.0 in | Wt 131.0 lb

## 2021-11-21 DIAGNOSIS — Z113 Encounter for screening for infections with a predominantly sexual mode of transmission: Secondary | ICD-10-CM | POA: Insufficient documentation

## 2021-11-21 DIAGNOSIS — Z01419 Encounter for gynecological examination (general) (routine) without abnormal findings: Secondary | ICD-10-CM | POA: Diagnosis not present

## 2021-11-21 MED ORDER — SLYND 4 MG PO TABS: 1.0000 | ORAL_TABLET | Freq: Every day | ORAL | 11 refills | Status: DC

## 2021-11-21 NOTE — Progress Notes (Signed)
Pt used the Slynd samples from her last appt and had no issues with it

## 2021-11-21 NOTE — Progress Notes (Signed)
Patient ID: Heather Wolfe, female   DOB: 04-17-1996, 26 y.o.   MRN: [id number]     GYNECOLOGY ANNUAL PREVENTATIVE CARE ENCOUNTER NOTE  History:     Heather Wolfe is a 26 y.o. G0P0000 female here for a routine annual gynecologic exam.  Current complaints: recent yeast infections.   Denies abnormal vaginal bleeding, discharge, pelvic pain, problems with intercourse or other gynecologic concerns.  Paternal Aunt diagnosed with breast Ca at age 44, she past away at age 51 with breast CA. Wants to know if she qualify's for testing.   Gynecologic History Patient's last menstrual period was 09/28/2021. Contraception: oral progesterone-only contraceptive Last Pap: 2018.  Result was normal with negative HPV Last Mammogram: NA Last Colonoscopy: NA  Obstetric History OB History  Gravida Para Term Preterm AB Living  0 0 0 0 0 0  SAB IAB Ectopic Multiple Live Births  0 0 0 0 0    Past Medical History:  Diagnosis Date   Anxiety    Depression     Past Surgical History:  Procedure Laterality Date   NO PAST SURGERIES      Current Outpatient Medications on File Prior to Visit  Medication Sig Dispense Refill   Drospirenone (SLYND PO) Take by mouth.     norethindrone (ORTHO MICRONOR) 0.35 MG tablet Take 1 tablet (0.35 mg total) by mouth daily. (Patient not taking: Reported on 11/21/2021) 28 tablet 3   No current facility-administered medications on file prior to visit.    Allergies  Allergen Reactions   Sulfa Antibiotics Hives    Fever, chills   Sulfamethoxazole-Trimethoprim Hives, Other (See Comments) and Rash    Fever, chills    Social History:  reports that she has never smoked. She has never used smokeless tobacco. She reports current alcohol use of about 1.0 - 2.0 standard drink per week. She reports that she does not use drugs.  Family History  Problem Relation Age of Onset   Diabetes Mother    Hypertension Mother    Breast cancer Paternal Aunt    Diabetes Maternal  Grandfather    Diabetes Paternal Grandmother     The following portions of the patient's history were reviewed and updated as appropriate: allergies, current medications, past family history, past medical history, past social history, past surgical history and problem list.  Review of Systems Pertinent items noted in HPI and remainder of comprehensive ROS otherwise negative.  Physical Exam:  BP 114/72    Pulse 73    Ht 5\' 4"  (1.626 m)    Wt 131 lb (59.4 kg)    LMP 09/28/2021    BMI 22.49 kg/m  CONSTITUTIONAL: Well-developed, well-nourished female in no acute distress.  HENT:  Normocephalic, atraumatic, External right and left ear normal.  EYES: Conjunctivae and EOM are normal. Pupils are equal, round, and reactive to light. No scleral icterus.  NECK: Normal range of motion, supple, no masses.  Normal thyroid.  SKIN: Skin is warm and dry. No rash noted. Not diaphoretic. No erythema. No pallor. MUSCULOSKELETAL: Normal range of motion. No tenderness.  No cyanosis, clubbing, or edema. NEUROLOGIC: Alert and oriented to person, place, and time. Normal reflexes, muscle tone coordination.  PSYCHIATRIC: Normal mood and affect. Normal behavior. Normal judgment and thought content. CARDIOVASCULAR: Normal heart rate noted, regular rhythm RESPIRATORY: Clear to auscultation bilaterally. Effort and breath sounds normal, no problems with respiration noted. BREASTS: Symmetric in size. No masses, tenderness, skin changes, nipple drainage, or lymphadenopathy bilaterally. Performed in the presence of a  chaperone. ABDOMEN: Soft, no distention noted.  No tenderness, rebound or guarding.  PELVIC: Normal appearing external genitalia and urethral meatus; normal appearing vaginal mucosa and cervix.  No abnormal vaginal discharge noted.  Pap smear obtained.  Normal uterine size, no other palpable masses, no uterine or adnexal tenderness.  Performed in the presence of a chaperone.   Assessment and Plan:   1. Well  woman exam  - Cytology - PAP( [place]) - Rx Slynd  2. Routine screening for STI (sexually transmitted infection)  - Cytology - PAP( [place]) - Cervicovaginal ancillary only( [place])   Will follow up results of pap smear and manage accordingly. Routine preventative health maintenance measures emphasized. Please refer to After Visit Summary for other counseling recommendations.     Heather Wolfe, Heather Rutherford, NP [facility] [employer], [facility]

## 2021-11-24 LAB — CERVICOVAGINAL ANCILLARY ONLY
Candida Glabrata: NEGATIVE
Candida Vaginitis: NEGATIVE
Chlamydia: NEGATIVE
Comment: NEGATIVE
Comment: NEGATIVE
Comment: NEGATIVE
Comment: NEGATIVE
Comment: NORMAL
Neisseria Gonorrhea: NEGATIVE
Trichomonas: NEGATIVE

## 2021-11-24 LAB — CYTOLOGY - PAP: Diagnosis: NEGATIVE

## 2021-11-25 ENCOUNTER — Ambulatory Visit (INDEPENDENT_AMBULATORY_CARE_PROVIDER_SITE_OTHER): Payer: BC Managed Care – PPO | Admitting: Psychology

## 2021-11-25 DIAGNOSIS — F411 Generalized anxiety disorder: Secondary | ICD-10-CM

## 2021-11-25 NOTE — Progress Notes (Signed)
[facility] Counselor/Therapist Progress Note  Patient ID: Heather Wolfe, MRN: [id number],    Date: 11/25/2021  Time Spent: 11:00am-11:55am   55 minutes   Treatment Type: Individual Therapy  Reported Symptoms: stress, anxiety  Mental Status Exam: Appearance:  Casual     Behavior: Appropriate  Motor: Normal  Speech/Language:  Normal Rate  Affect: Appropriate  Mood: normal  Thought process: normal  Thought content:   WNL  Sensory/Perceptual disturbances:   WNL  Orientation: oriented to person, place, time/date, and situation  Attention: Good  Concentration: Good  Memory: WNL  Fund of knowledge:  Good  Insight:   Good  Judgment:  Good  Impulse Control: Good   Risk Assessment: Danger to Self:  No Self-injurious Behavior: No Danger to Others: No Duty to Warn:no Physical Aggression / Violence:No  Access to Firearms a concern: No  Gang Involvement:No   Subjective: Pt present for face-to-face individual therapy via Webex.   Pt consents to telehealth video therapy due to COVID 19 pandemic. Location of pt: home. Location of therapist: home office.   Pt talked about traveling to the [place].   Pt states it has already started to be stressful.   Her family is anxious and pt is getting anxious as well about the trip.  The plans keep changing and it is frustrating for pt.  Her father is getting frustrated with pt which is stressful for her.   Pt talked about the family dynamics.   She is feeling pressure from her family to get everything right.   Pt will be on her trip for 2 weeks. Pt talked about work.  She has a lot to get done before her two week vacation and she feels a lot of stress.  Pt is also upset that her boss is leaving bc they have a really good working relationship.   Worked on Optician, dispensing.   Pt talked about her relationship with [name].   She is realizing that she does not handle disappointment well.  She feels disappointed that  [name] is not a handyman and can't help her with things around her house in a way she would like.   She is disappointed that [name] does not like to travel or talk on the phone as much as she does.   Helped pt process her feelings and relationship dynamics.   Provided supportive therapy.     Interventions: Cognitive Behavioral Therapy and Insight-Oriented  Diagnosis:  F41.1  Plan:   Plan to continue to meet every two weeks.   Pt is progressing toward her treatment goals.    Treatment Plan   (Treatment Plan target date:  10/28/2022) Client Abilities/Strengths  Pt is bright, engaging and motivated for therapy.  Client Treatment Preferences  Individual therapy.  Client Statement of Needs  Improve coping skills.  Symptoms  Autonomic hyperactivity (e.g., palpitations, shortness of breath, dry mouth, trouble swallowing, nausea, diarrhea). Excessive and/or unrealistic worry that is difficult to control occurring more days than not for at least 6 months about a number of events or activities. Hypervigilance (e.g., feeling constantly on edge, experiencing concentration difficulties, having trouble falling or staying asleep, exhibiting a general state of irritability). Motor tension (e.g., restlessness, tiredness, shakiness, muscle tension). Problems Addressed  Anxiety Goals 1. Enhance ability to effectively cope with the full variety of life's worries and anxieties. 2. Learn and implement coping skills that result in a reduction of anxiety and worry, and improved daily functioning. Objective Learn to accept limitations in  life and commit to tolerating, rather than avoiding, unpleasant emotions while accomplishing meaningful goals. Target Date: 2022-10-28 Frequency: Biweekly Progress: 30 Modality: individual Related Interventions 1. Use techniques from Acceptance and Commitment Therapy to help client accept uncomfortable realities such as lack of complete control, imperfections, and uncertainty and  tolerate unpleasant emotions and thoughts in order to accomplish value-consistent goals. Objective Learn and implement problem-solving strategies for realistically addressing worries. Target Date: 2022-10-28 Frequency: Biweekly Progress: 30 Modality: individual Related Interventions 1. Assign the client a homework exercise in which he/she problem-solves a current problem.  review, reinforce success, and provide corrective feedback toward improvement. 2. Teach the client problem-solving strategies involving specifically defining a problem, generating options for addressing it, evaluating the pros and cons of each option, selecting and implementing an optional action, and reevaluating and refining the action. Objective Learn and implement calming skills to reduce overall anxiety and manage anxiety symptoms. Target Date: 2022-10-28 Frequency: Biweekly Progress: 30 Modality: individual Related Interventions 1. Assign the client to read about progressive muscle relaxation and other calming strategies in relevant books or treatment manuals (e.g., Progressive Relaxation Training by Robb Matar and Alen Blew; Mastery of Your Anxiety and Worry: Workbook by Earlie Counts). 2. Assign the client homework each session in which he/she practices relaxation exercises daily, gradually applying them progressively from non-anxiety-provoking to anxiety-provoking situations; review and reinforce success while providing corrective feedback toward improvement. 3. Teach the client calming/relaxation skills (e.g., applied relaxation, progressive muscle relaxation, cue controlled relaxation; mindful breathing; biofeedback) and how to discriminate better between relaxation and tension; teach the client how to apply these skills to his/her daily life. 3. Reduce overall frequency, intensity, and duration of the anxiety so that daily functioning is not impaired. 4. Resolve the core conflict that is the source of anxiety. 5.  Stabilize anxiety level while increasing ability to function on a daily basis. Diagnosis :    F41.1  Generalized Anxiety Disorder  Conditions For Discharge Achievement of treatment goals and objectives.   Orhan Mayorga, LCSW

## 2021-12-15 ENCOUNTER — Ambulatory Visit (INDEPENDENT_AMBULATORY_CARE_PROVIDER_SITE_OTHER): Payer: BC Managed Care – PPO | Admitting: Psychology

## 2021-12-15 DIAGNOSIS — F411 Generalized anxiety disorder: Secondary | ICD-10-CM | POA: Diagnosis not present

## 2021-12-15 NOTE — Progress Notes (Signed)
[facility] Counselor/Therapist Progress Note ? ?Patient ID: Heather Wolfe, MRN: [id number],   ? ?Date: 12/15/2021 ? ?Time Spent: 11:00am-11:55am   55 minutes  ? ?Treatment Type: Individual Therapy ? ?Reported Symptoms: stress, anxiety ? ?Mental Status Exam: ?Appearance:  Casual     ?Behavior: Appropriate  ?Motor: Normal  ?Speech/Language:  Normal Rate  ?Affect: Appropriate  ?Mood: normal  ?Thought process: normal  ?Thought content:   WNL  ?Sensory/Perceptual disturbances:   WNL  ?Orientation: oriented to person, place, time/date, and situation  ?Attention: Good  ?Concentration: Good  ?Memory: WNL  ?Fund of knowledge:  Good  ?Insight:   Good  ?Judgment:  Good  ?Impulse Control: Good  ? ?Risk Assessment: ?Danger to Self:  No ?Self-injurious Behavior: No ?Danger to Others: No ?Duty to Warn:no ?Physical Aggression / Violence:No  ?Access to Firearms a concern: No  ?Gang Involvement:No  ? ?Subjective: Pt present for face-to-face individual therapy via Webex.   Pt consents to telehealth video therapy due to COVID 19 pandemic. ?Location of pt: home. ?Location of therapist: home office.   ?Pt talked about her trip to the [place].   The travel was stressful bc there were so many delays.  She ended up being 24 hours late for her arrival.  Addressed how anxious pt got when traveling.   Addressed calming strategies.   ?Pt states her family makes her anxious.  Pt states most of her family has a lot of anxiety.   Her grandmother was a "loose cannon" and was not good with money.   It was frustrating being with her at times.  Addressed the family dynamics.   ?Pt talked about work.  She has things to catch up on from being on vacation.  Pt's boss left and her position is not filled yet.   ?Worked on Child psychotherapist.   ?Pt talked about her relationship with [name].   She appreciates him more after being with her family in the [place].  There are many traits that Kirk Ruths has that pt feels are good for her.   Helped pt process her feelings and relationship dynamics.   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  She is behind in her work bc of her vacation and has some catch up to do.   ?Provided supportive therapy.   ? ? ?Interventions: Cognitive Behavioral Therapy and Insight-Oriented ? ?Diagnosis:  F41.1 ? ?Plan:   Plan to continue to meet every two weeks.   Pt is progressing toward her treatment goals.   ? ?Treatment Plan   (Treatment Plan target date:  10/28/2022) ?Client Abilities/Strengths  ?Pt is bright, engaging and motivated for therapy.  ?Client Treatment Preferences  ?Individual therapy.  ?Client Statement of Needs  ?Improve coping skills.  ?Symptoms  ?Autonomic hyperactivity (e.g., palpitations, shortness of breath, dry mouth, trouble swallowing, nausea, diarrhea). Excessive and/or unrealistic worry that is difficult to control occurring more days than not for at least 6 months about a number of events or activities. Hypervigilance (e.g., feeling constantly on edge, experiencing concentration difficulties, having trouble falling or staying asleep, exhibiting a general state of irritability). Motor tension (e.g., restlessness, tiredness, shakiness, muscle tension). ?Problems Addressed  ?Anxiety ?Goals ?1. Enhance ability to effectively cope with the full variety of life's worries and anxieties. ?2. Learn and implement coping skills that result in a reduction of anxiety and worry, and improved daily functioning. ?Objective ?Learn to accept limitations in life and commit to tolerating, rather than avoiding, unpleasant emotions while accomplishing meaningful goals. ?Target Date:  2022-10-28 Frequency: Biweekly ?Progress: 30 Modality: individual ?Related Interventions ?1. Use techniques from Acceptance and Commitment Therapy to help client accept uncomfortable realities such as lack of complete control, imperfections, and uncertainty and tolerate unpleasant emotions and thoughts in order to accomplish value-consistent  goals. ?Objective ?Learn and implement problem-solving strategies for realistically addressing worries. ?Target Date: 2022-10-28 Frequency: Biweekly ?Progress: 30 Modality: individual ?Related Interventions ?1. Assign the client a homework exercise in which he/she problem-solves a current problem.  review, reinforce success, and provide corrective feedback toward improvement. ?2. Teach the client problem-solving strategies involving specifically defining a problem, generating options for addressing it, evaluating the pros and cons of each option, selecting and implementing an optional action, and reevaluating and refining the action. ?Objective ?Learn and implement calming skills to reduce overall anxiety and manage anxiety symptoms. ?Target Date: 2022-10-28 Frequency: Biweekly ?Progress: 30 Modality: individual ?Related Interventions ?1. Assign the client to read about progressive muscle relaxation and other calming strategies in relevant books or treatment manuals (e.g., Progressive Relaxation Training by Gwynneth Aliment and Dani Gobble; Mastery of Your Anxiety and Worry: Workbook by Beckie Busing). ?2. Assign the client homework each session in which he/she practices relaxation exercises daily, gradually applying them progressively from non-anxiety-provoking to anxiety-provoking situations; review and reinforce success while providing corrective feedback toward improvement. ?3. Teach the client calming/relaxation skills (e.g., applied relaxation, progressive muscle relaxation, cue controlled relaxation; mindful breathing; biofeedback) and how to discriminate better between relaxation and tension; teach the client how to apply these skills to his/her daily life. ?3. Reduce overall frequency, intensity, and duration of the anxiety so that daily functioning is not impaired. ?4. Resolve the core conflict that is the source of anxiety. ?5. Stabilize anxiety level while increasing ability to function on a daily  basis. ?Diagnosis :    F41.1  Generalized Anxiety Disorder  ?Conditions For Discharge ?Achievement of treatment goals and objectives. ? ? ?Marcelina Mclaurin, LCSW ? ? ? ?

## 2021-12-22 ENCOUNTER — Other Ambulatory Visit: Payer: Self-pay

## 2021-12-22 ENCOUNTER — Ambulatory Visit (INDEPENDENT_AMBULATORY_CARE_PROVIDER_SITE_OTHER): Payer: BC Managed Care – PPO | Admitting: Physician Assistant

## 2021-12-22 ENCOUNTER — Encounter: Payer: Self-pay | Admitting: Physician Assistant

## 2021-12-22 VITALS — BP 123/77 | HR 77 | Temp 98.1°F | Ht 64.0 in | Wt 137.0 lb

## 2021-12-22 DIAGNOSIS — R69 Illness, unspecified: Secondary | ICD-10-CM

## 2021-12-22 DIAGNOSIS — R319 Hematuria, unspecified: Secondary | ICD-10-CM

## 2021-12-22 DIAGNOSIS — R197 Diarrhea, unspecified: Secondary | ICD-10-CM

## 2021-12-22 LAB — POCT URINALYSIS DIP (CLINITEK)
Bilirubin, UA: NEGATIVE
Glucose, UA: NEGATIVE mg/dL
Ketones, POC UA: NEGATIVE mg/dL
Leukocytes, UA: NEGATIVE
Nitrite, UA: NEGATIVE
POC PROTEIN,UA: NEGATIVE
Spec Grav, UA: 1.025 (ref 1.010–1.025)
Urobilinogen, UA: 0.2 E.U./dL
pH, UA: 7.5 (ref 5.0–8.0)

## 2021-12-22 MED ORDER — AZITHROMYCIN 500 MG PO TABS: 500.0000 mg | ORAL_TABLET | Freq: Once | ORAL | 0 refills | Status: AC

## 2021-12-22 MED ORDER — DIPHENOXYLATE-ATROPINE 2.5-0.025 MG PO TABS: ORAL_TABLET | ORAL | 0 refills | Status: DC

## 2021-12-22 NOTE — Patient Instructions (Signed)
Diarrhea, Adult ?Diarrhea is frequent loose and watery bowel movements. Diarrhea can make you feel weak and cause you to become dehydrated. Dehydration can make you tired and thirsty, cause you to have a dry mouth, and decrease how often you urinate. ?Diarrhea typically lasts 2-3 days. However, it can last longer if it is a sign of something more serious. It is important to treat your diarrhea as told by your health care provider. ?Follow these instructions at home: ?Eating and drinking ?  ?Follow these recommendations as told by your health care provider: ?Take an oral rehydration solution (ORS). This is an over-the-counter medicine that helps return your body to its normal balance of nutrients and water. It is found at pharmacies and retail stores. ?Drink plenty of fluids, such as water, ice chips, diluted fruit juice, and low-calorie sports drinks. You can drink milk also, if desired. ?Avoid drinking fluids that contain a lot of sugar or caffeine, such as energy drinks, sports drinks, and soda. ?Eat bland, easy-to-digest foods in small amounts as you are able. These foods include bananas, applesauce, rice, lean meats, toast, and crackers. ?Avoid alcohol. ?Avoid spicy or fatty foods. ? ?Medicines ?Take over-the-counter and prescription medicines only as told by your health care provider. ?If you were prescribed an antibiotic medicine, take it as told by your health care provider. Do not stop using the antibiotic even if you start to feel better. ?General instructions ? ?Wash your hands often using soap and water. If soap and water are not available, use a hand sanitizer. Others in the household should wash their hands as well. Hands should be washed: ?After using the toilet or changing a diaper. ?Before preparing, cooking, or serving food. ?While caring for a sick person or while visiting someone in a hospital. ?Drink enough fluid to keep your urine pale yellow. ?Rest at home while you recover. ?Watch your  condition for any changes. ?Take a warm bath to relieve any burning or pain from frequent diarrhea episodes. ?Keep all follow-up visits as told by your health care provider. This is important. ?Contact a health care provider if: ?You have a fever. ?Your diarrhea gets worse. ?You have new symptoms. ?You cannot keep fluids down. ?You feel light-headed or dizzy. ?You have a headache. ?You have muscle cramps. ?Get help right away if: ?You have chest pain. ?You feel extremely weak or you faint. ?You have bloody or black stools or stools that look like tar. ?You have severe pain, cramping, or bloating in your abdomen. ?You have trouble breathing or you are breathing very quickly. ?Your heart is beating very quickly. ?Your skin feels cold and clammy. ?You feel confused. ?You have signs of dehydration, such as: ?Dark urine, very little urine, or no urine. ?Cracked lips. ?Dry mouth. ?Sunken eyes. ?Sleepiness. ?Weakness. ?Summary ?Diarrhea is frequent loose and sometimes watery bowel movements. Diarrhea can make you feel weak and cause you to become dehydrated. ?Drink enough fluids to keep your urine pale yellow. ?Make sure that you wash your hands after using the toilet. If soap and water are not available, use hand sanitizer. ?Contact a health care provider if your diarrhea gets worse or you have new symptoms. ?Get help right away if you have signs of dehydration. ?This information is not intended to replace advice given to you by your health care provider. Make sure you discuss any questions you have with your health care provider. ?Document Revised: 03/26/2021 Document Reviewed: 03/26/2021 ?Elsevier Patient Education ? 2022 Elsevier Inc. ? ?

## 2021-12-22 NOTE — Progress Notes (Signed)
? ?Subjective:  ? ? Patient ID: Heather Wolfe, female    DOB: 01/02/1996, 25 y.o.   MRN: [id number] ? ?Diarrhea  ?Pertinent negatives include no chills, fever or vomiting.  ?Heather Wolfe is a 26 yo female presenting to the clinic today with complaints of diarrhea. ? ?This started while on a trip to the [place] 3 weeks ago. She has been having about 1-2 loose stools per day with associated abdominal pain and some nausea. She denies any fevers, vomiting, or hematochezia. She states this has happened to her before in the past.  ? ?.. ?Active Ambulatory Problems  ?  Diagnosis Date Noted  ? Uses birth control 11/22/2020  ? Diarrhea of presumed infectious origin 12/22/2021  ? Travel-related illness 12/22/2021  ? Hematuria 12/22/2021  ? ?Resolved Ambulatory Problems  ?  Diagnosis Date Noted  ? No Resolved Ambulatory Problems  ? ?Past Medical History:  ?Diagnosis Date  ? Anxiety   ? Depression   ? ? ? ? ?Review of Systems  ?Constitutional:  Negative for chills and fever.  ?Gastrointestinal:  Positive for diarrhea and nausea. Negative for blood in stool, constipation and vomiting.  ? ?   ?Objective:  ? Physical Exam ?Constitutional:   ?   Appearance: Normal appearance.  ?Cardiovascular:  ?   Rate and Rhythm: Normal rate and regular rhythm.  ?Pulmonary:  ?   Effort: Pulmonary effort is normal.  ?   Breath sounds: Normal breath sounds.  ?Abdominal:  ?   General: Abdomen is flat.  ?   Palpations: Abdomen is soft.  ?   Tenderness: There is generalized abdominal tenderness.  ?Neurological:  ?   Mental Status: She is alert.  ?.. ?Results for orders placed or performed in visit on 12/22/21  ?POCT URINALYSIS DIP (CLINITEK)  ?Result Value Ref Range  ? Color, UA yellow yellow  ? Clarity, UA clear clear  ? Glucose, UA negative negative mg/dL  ? Bilirubin, UA negative negative  ? Ketones, POC UA negative negative mg/dL  ? Spec Grav, UA 1.025 1.010 - 1.025  ? Blood, UA trace-intact (A) negative  ? pH, UA 7.5 5.0 - 8.0  ? POC  PROTEIN,UA negative negative, trace  ? Urobilinogen, UA 0.2 0.2 or 1.0 E.U./dL  ? Nitrite, UA Negative Negative  ? Leukocytes, UA Negative Negative  ? ? ?   ?Assessment & Plan:  ? ?Marland KitchenIlla Level was seen today for diarrhea. ? ?Diagnoses and all orders for this visit: ? ?Diarrhea of presumed infectious origin ?-     Ova and parasite examination ?-     Salmonella/Shigella Cult, Campy EIA and Shiga Toxin reflex ?-     diphenoxylate-atropine (LOMOTIL) 2.5-0.025 MG tablet; One to 2 tablets by mouth 4 times a day as needed for diarrhea. ?-     azithromycin (ZITHROMAX) 500 MG tablet; Take 1 tablet (500 mg total) by mouth once for 1 dose. For 3 days. ? ?Travel-related illness ?-     Ova and parasite examination ?-     Salmonella/Shigella Cult, Campy EIA and Shiga Toxin reflex ?-     diphenoxylate-atropine (LOMOTIL) 2.5-0.025 MG tablet; One to 2 tablets by mouth 4 times a day as needed for diarrhea. ?-     azithromycin (ZITHROMAX) 500 MG tablet; Take 1 tablet (500 mg total) by mouth once for 1 dose. For 3 days. ? ?Hematuria, unspecified type ?-     POCT URINALYSIS DIP (CLINITEK) ? ? ? ?Suspect traveler's diarrhea due to history of recent travel and onset  of symptoms. Obtaining a stool panel to identify causative agent and help direct treatment. UA showed trace blood, but no signs of infection. C.Diff is least likely due to no recent history of antibiotic use and having only 1-2 BM per day. Starting on Azithromycin and Lomotil along with BRAT diet to help ease symptoms. Should be feeling better in 24-48 hrs. Will want to see back in the office if symptoms do not improve or worsen.  ? ?Of note, the trace blood found in the UA is likely due to inflammation. Will want to recheck this after infection has passed to confirm.  ? ? ?

## 2021-12-23 DIAGNOSIS — R69 Illness, unspecified: Secondary | ICD-10-CM | POA: Diagnosis not present

## 2021-12-23 DIAGNOSIS — R197 Diarrhea, unspecified: Secondary | ICD-10-CM | POA: Diagnosis not present

## 2021-12-26 LAB — OVA AND PARASITE EXAMINATION
CONCENTRATE RESULT:: NONE SEEN
MICRO NUMBER:: 13192203
SPECIMEN QUALITY:: ADEQUATE
TRICHROME RESULT:: NONE SEEN

## 2021-12-26 LAB — SALMONELLA/SHIGELLA CULT, CAMPY EIA AND SHIGA TOXIN RFL ECOLI
MICRO NUMBER: 13192234
MICRO NUMBER:: 13192235
MICRO NUMBER:: 13192236
Result:: NOT DETECTED
SHIGA RESULT:: NOT DETECTED
SPECIMEN QUALITY: ADEQUATE
SPECIMEN QUALITY:: ADEQUATE
SPECIMEN QUALITY:: ADEQUATE

## 2021-12-29 ENCOUNTER — Encounter: Payer: Self-pay | Admitting: Physician Assistant

## 2021-12-29 NOTE — Progress Notes (Signed)
No ova or parasites.  ?No common bacteria seen.  ?Has your diarrhea cleared?

## 2021-12-29 NOTE — Progress Notes (Signed)
You could certainly add some probiotics to see if would help bulk up stool some.

## 2022-01-05 ENCOUNTER — Ambulatory Visit (INDEPENDENT_AMBULATORY_CARE_PROVIDER_SITE_OTHER): Payer: BC Managed Care – PPO | Admitting: Psychology

## 2022-01-05 DIAGNOSIS — F411 Generalized anxiety disorder: Secondary | ICD-10-CM | POA: Diagnosis not present

## 2022-01-05 NOTE — Progress Notes (Signed)
[facility] Counselor/Therapist Progress Note ? ?Patient ID: Heather Wolfe, MRN: [id number],   ? ?Date: 01/05/2022 ? ?Time Spent: 11:00am-11:55am   55 minutes  ? ?Treatment Type: Individual Therapy ? ?Reported Symptoms: stress, anxiety ? ?Mental Status Exam: ?Appearance:  Casual     ?Behavior: Appropriate  ?Motor: Normal  ?Speech/Language:  Normal Rate  ?Affect: Appropriate  ?Mood: normal  ?Thought process: normal  ?Thought content:   WNL  ?Sensory/Perceptual disturbances:   WNL  ?Orientation: oriented to person, place, time/date, and situation  ?Attention: Good  ?Concentration: Good  ?Memory: WNL  ?Fund of knowledge:  Good  ?Insight:   Good  ?Judgment:  Good  ?Impulse Control: Good  ? ?Risk Assessment: ?Danger to Self:  No ?Self-injurious Behavior: No ?Danger to Others: No ?Duty to Warn:no ?Physical Aggression / Violence:No  ?Access to Firearms a concern: No  ?Gang Involvement:No  ? ?Subjective: Pt present for face-to-face individual therapy via Webex.   Pt consents to telehealth video therapy due to COVID 19 pandemic. ?Location of pt: home. ?Location of therapist: home office.   ?Pt talked about her relationship with [name].   He talked to pt about having concerns about her relationship with her parents.  [name] feels like pt takes her parents advice a lot and he feels they are too involved in pt's life.  They had an argument last week about pt being late.  Addressed the interaction.  Pt is realizing how different she and Kirk Ruths are and how different their families are.  Pt holds back her thoughts and feelings too much and does not represent her interests enough in the relationship.  Helped pt process her feelings and relationship dynamics.   ?Pt talked about work.  It is really busy and stressful.  Worked on Child psychotherapist.   ?Provided supportive therapy.   ? ? ?Interventions: Cognitive Behavioral Therapy and Insight-Oriented ? ?Diagnosis:  F41.1 ? ?Plan:   Plan to continue to meet every two  weeks.   Pt is progressing toward her treatment goals.   ? ?Treatment Plan   (Treatment Plan target date:  10/28/2022) ?Client Abilities/Strengths  ?Pt is bright, engaging and motivated for therapy.  ?Client Treatment Preferences  ?Individual therapy.  ?Client Statement of Needs  ?Improve coping skills.  ?Symptoms  ?Autonomic hyperactivity (e.g., palpitations, shortness of breath, dry mouth, trouble swallowing, nausea, diarrhea). Excessive and/or unrealistic worry that is difficult to control occurring more days than not for at least 6 months about a number of events or activities. Hypervigilance (e.g., feeling constantly on edge, experiencing concentration difficulties, having trouble falling or staying asleep, exhibiting a general state of irritability). Motor tension (e.g., restlessness, tiredness, shakiness, muscle tension). ?Problems Addressed  ?Anxiety ?Goals ?1. Enhance ability to effectively cope with the full variety of life's worries and anxieties. ?2. Learn and implement coping skills that result in a reduction of anxiety and worry, and improved daily functioning. ?Objective ?Learn to accept limitations in life and commit to tolerating, rather than avoiding, unpleasant emotions while accomplishing meaningful goals. ?Target Date: 2022-10-28 Frequency: Biweekly ?Progress: 30 Modality: individual ?Related Interventions ?1. Use techniques from Acceptance and Commitment Therapy to help client accept uncomfortable realities such as lack of complete control, imperfections, and uncertainty and tolerate unpleasant emotions and thoughts in order to accomplish value-consistent goals. ?Objective ?Learn and implement problem-solving strategies for realistically addressing worries. ?Target Date: 2022-10-28 Frequency: Biweekly ?Progress: 30 Modality: individual ?Related Interventions ?1. Assign the client a homework exercise in which he/she problem-solves a current problem.  review,  reinforce success, and provide  corrective feedback toward improvement. ?2. Teach the client problem-solving strategies involving specifically defining a problem, generating options for addressing it, evaluating the pros and cons of each option, selecting and implementing an optional action, and reevaluating and refining the action. ?Objective ?Learn and implement calming skills to reduce overall anxiety and manage anxiety symptoms. ?Target Date: 2022-10-28 Frequency: Biweekly ?Progress: 30 Modality: individual ?Related Interventions ?1. Assign the client to read about progressive muscle relaxation and other calming strategies in relevant books or treatment manuals (e.g., Progressive Relaxation Training by Gwynneth Aliment and Dani Gobble; Mastery of Your Anxiety and Worry: Workbook by Beckie Busing). ?2. Assign the client homework each session in which he/she practices relaxation exercises daily, gradually applying them progressively from non-anxiety-provoking to anxiety-provoking situations; review and reinforce success while providing corrective feedback toward improvement. ?3. Teach the client calming/relaxation skills (e.g., applied relaxation, progressive muscle relaxation, cue controlled relaxation; mindful breathing; biofeedback) and how to discriminate better between relaxation and tension; teach the client how to apply these skills to his/her daily life. ?3. Reduce overall frequency, intensity, and duration of the anxiety so that daily functioning is not impaired. ?4. Resolve the core conflict that is the source of anxiety. ?5. Stabilize anxiety level while increasing ability to function on a daily basis. ?Diagnosis :    F41.1  Generalized Anxiety Disorder  ?Conditions For Discharge ?Achievement of treatment goals and objectives. ? ? ?Caidence Kaseman, LCSW ? ? ?

## 2022-01-19 ENCOUNTER — Ambulatory Visit (INDEPENDENT_AMBULATORY_CARE_PROVIDER_SITE_OTHER): Payer: BC Managed Care – PPO | Admitting: Psychology

## 2022-01-19 DIAGNOSIS — F411 Generalized anxiety disorder: Secondary | ICD-10-CM

## 2022-01-19 NOTE — Progress Notes (Signed)
[facility] Counselor/Therapist Progress Note ? ?Patient ID: Heather Wolfe, MRN: [id number],   ? ?Date: 01/19/2022 ? ?Time Spent: 11:00am-11:55am   55 minutes  ? ?Treatment Type: Individual Therapy ? ?Reported Symptoms: stress, anxiety ? ?Mental Status Exam: ?Appearance:  Casual     ?Behavior: Appropriate  ?Motor: Normal  ?Speech/Language:  Normal Rate  ?Affect: Appropriate  ?Mood: normal  ?Thought process: normal  ?Thought content:   WNL  ?Sensory/Perceptual disturbances:   WNL  ?Orientation: oriented to person, place, time/date, and situation  ?Attention: Good  ?Concentration: Good  ?Memory: WNL  ?Fund of knowledge:  Good  ?Insight:   Good  ?Judgment:  Good  ?Impulse Control: Good  ? ?Risk Assessment: ?Danger to Self:  No ?Self-injurious Behavior: No ?Danger to Others: No ?Duty to Warn:no ?Physical Aggression / Violence:No  ?Access to Firearms a concern: No  ?Gang Involvement:No  ? ?Subjective: Pt present for face-to-face individual therapy via Webex.   Pt consents to telehealth video therapy due to COVID 19 pandemic. ?Location of pt: home. ?Location of therapist: home office.   ?Pt talked about being very tired bc she was at her friend's wedding this weekend and did not get home until late last night.   [name] and pt went to the wedding together.  The trip went well but pt was really busy.  Pt had a lot of anxiety about how Kirk Ruths would do during the trip.  Addressed pt's anxiety.   ?Pt talked about her relationship with [name]. [name] told pt he can imagine marrying her and that he thinks she is the one for him.  Pt states that made her feel good.   Helped pt process her feelings and relationship dynamics.   ?Pt talked about work.  It is really busy and stressful.  Worked on Child psychotherapist.  Addressed ways pt can decompress.  Identified what has helped in the past such as reading. ?Provided supportive therapy.   ? ? ?Interventions: Cognitive Behavioral Therapy and  Insight-Oriented ? ?Diagnosis:  F41.1 ? ?Plan:   Plan to continue to meet every two weeks.   Pt is progressing toward her treatment goals.   ? ?Treatment Plan   (Treatment Plan target date:  10/28/2022) ?Client Abilities/Strengths  ?Pt is bright, engaging and motivated for therapy.  ?Client Treatment Preferences  ?Individual therapy.  ?Client Statement of Needs  ?Improve coping skills.  ?Symptoms  ?Autonomic hyperactivity (e.g., palpitations, shortness of breath, dry mouth, trouble swallowing, nausea, diarrhea). Excessive and/or unrealistic worry that is difficult to control occurring more days than not for at least 6 months about a number of events or activities. Hypervigilance (e.g., feeling constantly on edge, experiencing concentration difficulties, having trouble falling or staying asleep, exhibiting a general state of irritability). Motor tension (e.g., restlessness, tiredness, shakiness, muscle tension). ?Problems Addressed  ?Anxiety ?Goals ?1. Enhance ability to effectively cope with the full variety of life's worries and anxieties. ?2. Learn and implement coping skills that result in a reduction of anxiety and worry, and improved daily functioning. ?Objective ?Learn to accept limitations in life and commit to tolerating, rather than avoiding, unpleasant emotions while accomplishing meaningful goals. ?Target Date: 2022-10-28 Frequency: Biweekly ?Progress: 30 Modality: individual ?Related Interventions ?1. Use techniques from Acceptance and Commitment Therapy to help client accept uncomfortable realities such as lack of complete control, imperfections, and uncertainty and tolerate unpleasant emotions and thoughts in order to accomplish value-consistent goals. ?Objective ?Learn and implement problem-solving strategies for realistically addressing worries. ?Target Date: 2022-10-28 Frequency: Biweekly ?Progress: 30  Modality: individual ?Related Interventions ?1. Assign the client a homework exercise in which  he/she problem-solves a current problem.  review, reinforce success, and provide corrective feedback toward improvement. ?2. Teach the client problem-solving strategies involving specifically defining a problem, generating options for addressing it, evaluating the pros and cons of each option, selecting and implementing an optional action, and reevaluating and refining the action. ?Objective ?Learn and implement calming skills to reduce overall anxiety and manage anxiety symptoms. ?Target Date: 2022-10-28 Frequency: Biweekly ?Progress: 30 Modality: individual ?Related Interventions ?1. Assign the client to read about progressive muscle relaxation and other calming strategies in relevant books or treatment manuals (e.g., Progressive Relaxation Training by Gwynneth Aliment and Dani Gobble; Mastery of Your Anxiety and Worry: Workbook by Beckie Busing). ?2. Assign the client homework each session in which he/she practices relaxation exercises daily, gradually applying them progressively from non-anxiety-provoking to anxiety-provoking situations; review and reinforce success while providing corrective feedback toward improvement. ?3. Teach the client calming/relaxation skills (e.g., applied relaxation, progressive muscle relaxation, cue controlled relaxation; mindful breathing; biofeedback) and how to discriminate better between relaxation and tension; teach the client how to apply these skills to his/her daily life. ?3. Reduce overall frequency, intensity, and duration of the anxiety so that daily functioning is not impaired. ?4. Resolve the core conflict that is the source of anxiety. ?5. Stabilize anxiety level while increasing ability to function on a daily basis. ?Diagnosis :    F41.1  Generalized Anxiety Disorder  ?Conditions For Discharge ?Achievement of treatment goals and objectives. ? ? ?Gabryelle Whitmoyer, LCSW ? ? ?

## 2022-01-26 ENCOUNTER — Other Ambulatory Visit: Payer: Self-pay | Admitting: *Deleted

## 2022-01-26 MED ORDER — SLYND 4 MG PO TABS: 1.0000 | ORAL_TABLET | Freq: Every day | ORAL | 3 refills | Status: DC

## 2022-02-02 ENCOUNTER — Telehealth: Payer: Self-pay | Admitting: *Deleted

## 2022-02-02 ENCOUNTER — Ambulatory Visit (INDEPENDENT_AMBULATORY_CARE_PROVIDER_SITE_OTHER): Payer: BC Managed Care – PPO | Admitting: Psychology

## 2022-02-02 DIAGNOSIS — F411 Generalized anxiety disorder: Secondary | ICD-10-CM | POA: Diagnosis not present

## 2022-02-02 MED ORDER — SLYND 4 MG PO TABS: 1.0000 | ORAL_TABLET | Freq: Every day | ORAL | 3 refills | Status: DC

## 2022-02-02 NOTE — Telephone Encounter (Cosign Needed)
Pt states she is having issues with [facility] and request that her [facility] RX be sent to [facility] in [place]. ?

## 2022-02-02 NOTE — Progress Notes (Signed)
[facility] Counselor/Therapist Progress Note ? ?Patient ID: Heather Wolfe, MRN: [id number],   ? ?Date: 02/02/2022 ? ?Time Spent: 11:00am-11:50am   50 minutes  ? ?Treatment Type: Individual Therapy ? ?Reported Symptoms: stress ? ?Mental Status Exam: ?Appearance:  Casual     ?Behavior: Appropriate  ?Motor: Normal  ?Speech/Language:  Normal Rate  ?Affect: Appropriate  ?Mood: normal  ?Thought process: normal  ?Thought content:   WNL  ?Sensory/Perceptual disturbances:   WNL  ?Orientation: oriented to person, place, time/date, and situation  ?Attention: Good  ?Concentration: Good  ?Memory: WNL  ?Fund of knowledge:  Good  ?Insight:   Good  ?Judgment:  Good  ?Impulse Control: Good  ? ?Risk Assessment: ?Danger to Self:  No ?Self-injurious Behavior: No ?Danger to Others: No ?Duty to Warn:no ?Physical Aggression / Violence:No  ?Access to Firearms a concern: No  ?Gang Involvement:No  ? ?Subjective: Pt present for face-to-face individual therapy via Webex.   Pt consents to telehealth video therapy due to COVID 19 pandemic. ?Location of pt: home. ?Location of therapist: home office.   ?Pt talked about work.   Work has been very stressful.  Worked on Optician, dispensing. 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   She did well this past semester.   ?Pt talked about her relationship with [name].   She states they have been doing very well lately.   Since [name] has decided he wants to marry pt they have gotten along even better.  Pt does not feel as anxious about the relationship now.  She feels like they are "pre-engaged".   Pt was thinking about getting engaged sometime this year.  Helped pt process her feelings and relationship dynamics.   ?Provided supportive therapy.   ? ?Interventions: Cognitive Behavioral Therapy and Insight-Oriented ? ?Diagnosis:  F41.1 ? ?Plan:   Plan to continue to meet every two weeks.   Pt is progressing toward her treatment goals.   ? ?Treatment Plan    (Treatment Plan target date:  10/28/2022) ?Client Abilities/Strengths  ?Pt is bright, engaging and motivated for therapy.  ?Client Treatment Preferences  ?Individual therapy.  ?Client Statement of Needs  ?Improve coping skills.  ?Symptoms  ?Autonomic hyperactivity (e.g., palpitations, shortness of breath, dry mouth, trouble swallowing, nausea, diarrhea). Excessive and/or unrealistic worry that is difficult to control occurring more days than not for at least 6 months about a number of events or activities. Hypervigilance (e.g., feeling constantly on edge, experiencing concentration difficulties, having trouble falling or staying asleep, exhibiting a general state of irritability). Motor tension (e.g., restlessness, tiredness, shakiness, muscle tension). ?Problems Addressed  ?Anxiety ?Goals ?1. Enhance ability to effectively cope with the full variety of life's worries and anxieties. ?2. Learn and implement coping skills that result in a reduction of anxiety and worry, and improved daily functioning. ?Objective ?Learn to accept limitations in life and commit to tolerating, rather than avoiding, unpleasant emotions while accomplishing meaningful goals. ?Target Date: 2022-10-28 Frequency: Biweekly ?Progress: 30 Modality: individual ?Related Interventions ?1. Use techniques from Acceptance and Commitment Therapy to help client accept uncomfortable realities such as lack of complete control, imperfections, and uncertainty and tolerate unpleasant emotions and thoughts in order to accomplish value-consistent goals. ?Objective ?Learn and implement problem-solving strategies for realistically addressing worries. ?Target Date: 2022-10-28 Frequency: Biweekly ?Progress: 30 Modality: individual ?Related Interventions ?1. Assign the client a homework exercise in which he/she problem-solves a current problem.  review, reinforce success, and provide corrective feedback toward improvement. ?2. Teach the  client problem-solving  strategies involving specifically defining a problem, generating options for addressing it, evaluating the pros and cons of each option, selecting and implementing an optional action, and reevaluating and refining the action. ?Objective ?Learn and implement calming skills to reduce overall anxiety and manage anxiety symptoms. ?Target Date: 2022-10-28 Frequency: Biweekly ?Progress: 30 Modality: individual ?Related Interventions ?1. Assign the client to read about progressive muscle relaxation and other calming strategies in relevant books or treatment manuals (e.g., Progressive Relaxation Training by Robb Matar and Alen Blew; Mastery of Your Anxiety and Worry: Workbook by Earlie Counts). ?2. Assign the client homework each session in which he/she practices relaxation exercises daily, gradually applying them progressively from non-anxiety-provoking to anxiety-provoking situations; review and reinforce success while providing corrective feedback toward improvement. ?3. Teach the client calming/relaxation skills (e.g., applied relaxation, progressive muscle relaxation, cue controlled relaxation; mindful breathing; biofeedback) and how to discriminate better between relaxation and tension; teach the client how to apply these skills to his/her daily life. ?3. Reduce overall frequency, intensity, and duration of the anxiety so that daily functioning is not impaired. ?4. Resolve the core conflict that is the source of anxiety. ?5. Stabilize anxiety level while increasing ability to function on a daily basis. ?Diagnosis :    F41.1  Generalized Anxiety Disorder  ?Conditions For Discharge ?Achievement of treatment goals and objectives. ? ? ?Viraj Liby, LCSW ? ? ? ? ?

## 2022-03-02 ENCOUNTER — Ambulatory Visit (INDEPENDENT_AMBULATORY_CARE_PROVIDER_SITE_OTHER): Payer: BC Managed Care – PPO | Admitting: Psychology

## 2022-03-02 DIAGNOSIS — F411 Generalized anxiety disorder: Secondary | ICD-10-CM

## 2022-03-02 NOTE — Progress Notes (Signed)
[facility] Counselor/Therapist Progress Note  Patient ID: Heather Wolfe, MRN: [id number],    Date: 03/02/2022  Time Spent: 11:00am-11:50am   50 minutes   Treatment Type: Individual Therapy  Reported Symptoms: stress  Mental Status Exam: Appearance:  Casual     Behavior: Appropriate  Motor: Normal  Speech/Language:  Normal Rate  Affect: Appropriate  Mood: normal  Thought process: normal  Thought content:   WNL  Sensory/Perceptual disturbances:   WNL  Orientation: oriented to person, place, time/date, and situation  Attention: Good  Concentration: Good  Memory: WNL  Fund of knowledge:  Good  Insight:   Good  Judgment:  Good  Impulse Control: Good   Risk Assessment: Danger to Self:  No Self-injurious Behavior: No Danger to Others: No Duty to Warn:no Physical Aggression / Violence:No  Access to Firearms a concern: No  Gang Involvement:No   Subjective: Pt present for face-to-face individual therapy via Webex.   Pt consents to telehealth video therapy due to COVID 19 pandemic. Location of pt: home. Location of therapist: home office.  Pt talked about being so busy.  She has been busy with balancing work and summer school and social life.   Her school schedule is very rigorous.  Pt feels like she should decrease her social schedule but has trouble setting boundaries.    Pt talked about work.   Work has been very stressful.  Worked on Optician, dispensing. Pt states she has had more anxiety lately.  Addressed what has triggered her anxiety.   Pt tends to procrastinate which creates scheduling stress. Worked on calming and time management strategies.   Pt talked about her relationship with [name].  They are talking more about getting married.   They are talking about their values and are realizing they are aligned on important things like finances.    Pt is thinking about wedding planning.    Helped pt process her feelings and relationship dynamics.   Provided  supportive therapy.    Interventions: Cognitive Behavioral Therapy and Insight-Oriented  Diagnosis:  F41.1  Plan:   Plan to continue to meet every two weeks.   Pt is progressing toward her treatment goals.    Treatment Plan   (Treatment Plan target date:  10/28/2022) Client Abilities/Strengths  Pt is bright, engaging and motivated for therapy.  Client Treatment Preferences  Individual therapy.  Client Statement of Needs  Improve coping skills.  Symptoms  Autonomic hyperactivity (e.g., palpitations, shortness of breath, dry mouth, trouble swallowing, nausea, diarrhea). Excessive and/or unrealistic worry that is difficult to control occurring more days than not for at least 6 months about a number of events or activities. Hypervigilance (e.g., feeling constantly on edge, experiencing concentration difficulties, having trouble falling or staying asleep, exhibiting a general state of irritability). Motor tension (e.g., restlessness, tiredness, shakiness, muscle tension). Problems Addressed  Anxiety Goals 1. Enhance ability to effectively cope with the full variety of life's worries and anxieties. 2. Learn and implement coping skills that result in a reduction of anxiety and worry, and improved daily functioning. Objective Learn to accept limitations in life and commit to tolerating, rather than avoiding, unpleasant emotions while accomplishing meaningful goals. Target Date: 2022-10-28 Frequency: Biweekly Progress: 30 Modality: individual Related Interventions 1. Use techniques from Acceptance and Commitment Therapy to help client accept uncomfortable realities such as lack of complete control, imperfections, and uncertainty and tolerate unpleasant emotions and thoughts in order to accomplish value-consistent goals. Objective Learn and implement problem-solving strategies for realistically addressing worries. Target  Date: 2022-10-28 Frequency: Biweekly Progress: 30 Modality:  individual Related Interventions 1. Assign the client a homework exercise in which he/she problem-solves a current problem.  review, reinforce success, and provide corrective feedback toward improvement. 2. Teach the client problem-solving strategies involving specifically defining a problem, generating options for addressing it, evaluating the pros and cons of each option, selecting and implementing an optional action, and reevaluating and refining the action. Objective Learn and implement calming skills to reduce overall anxiety and manage anxiety symptoms. Target Date: 2022-10-28 Frequency: Biweekly Progress: 30 Modality: individual Related Interventions 1. Assign the client to read about progressive muscle relaxation and other calming strategies in relevant books or treatment manuals (e.g., Progressive Relaxation Training by Robb Matar and Alen Blew; Mastery of Your Anxiety and Worry: Workbook by Earlie Counts). 2. Assign the client homework each session in which he/she practices relaxation exercises daily, gradually applying them progressively from non-anxiety-provoking to anxiety-provoking situations; review and reinforce success while providing corrective feedback toward improvement. 3. Teach the client calming/relaxation skills (e.g., applied relaxation, progressive muscle relaxation, cue controlled relaxation; mindful breathing; biofeedback) and how to discriminate better between relaxation and tension; teach the client how to apply these skills to his/her daily life. 3. Reduce overall frequency, intensity, and duration of the anxiety so that daily functioning is not impaired. 4. Resolve the core conflict that is the source of anxiety. 5. Stabilize anxiety level while increasing ability to function on a daily basis. Diagnosis :    F41.1  Generalized Anxiety Disorder  Conditions For Discharge Achievement of treatment goals and objectives.   Shadow Stiggers, LCSW

## 2022-03-16 ENCOUNTER — Ambulatory Visit: Payer: BC Managed Care – PPO | Admitting: Psychology

## 2022-03-19 ENCOUNTER — Ambulatory Visit (INDEPENDENT_AMBULATORY_CARE_PROVIDER_SITE_OTHER): Payer: BC Managed Care – PPO | Admitting: Psychology

## 2022-03-19 DIAGNOSIS — F411 Generalized anxiety disorder: Secondary | ICD-10-CM

## 2022-03-19 NOTE — Progress Notes (Signed)
[facility] Counselor/Therapist Progress Note  Patient ID: Heather Wolfe, MRN: [id number],    Date: 03/19/2022  Time Spent: 11:00am-11:55am   55 minutes   Treatment Type: Individual Therapy  Reported Symptoms: stress  Mental Status Exam: Appearance:  Casual     Behavior: Appropriate  Motor: Normal  Speech/Language:  Normal Rate  Affect: Appropriate  Mood: normal  Thought process: normal  Thought content:   WNL  Sensory/Perceptual disturbances:   WNL  Orientation: oriented to person, place, time/date, and situation  Attention: Good  Concentration: Good  Memory: WNL  Fund of knowledge:  Good  Insight:   Good  Judgment:  Good  Impulse Control: Good   Risk Assessment: Danger to Self:  No Self-injurious Behavior: No Danger to Others: No Duty to Warn:no Physical Aggression / Violence:No  Access to Firearms a concern: No  Gang Involvement:No   Subjective: Pt present for face-to-face individual therapy via Webex.   Pt consents to telehealth video therapy due to COVID 19 pandemic. Location of pt: home. Location of therapist: home office.   Pt talked about work.   Work has been very stressful.  Addressed the stress.  Pt feels like quitting her job at times.   Sometimes she feels like she can not sustain the pace.  Worked on [employer], dispensing.  Pt is trying to work on how to emotionally regulate herself.  Pt has trouble "getting triggered and blowing up at times".   Pt feels she needs to meditate but she is so busy she does not have time.  Pt tends to take things personally.  Worked on emotional regulation skills and healthy boundary setting.  Provided supportive therapy.    Interventions: Cognitive Behavioral Therapy and Insight-Oriented  Diagnosis:  F41.1  Plan:   Plan to continue to meet every two weeks.   Pt is progressing toward her treatment goals.    Treatment Plan   (Treatment Plan target date:  10/28/2022) Client Abilities/Strengths  Pt is bright,  engaging and motivated for therapy.  Client Treatment Preferences  Individual therapy.  Client Statement of Needs  Improve coping skills.  Symptoms  Autonomic hyperactivity (e.g., palpitations, shortness of breath, dry mouth, trouble swallowing, nausea, diarrhea). Excessive and/or unrealistic worry that is difficult to control occurring more days than not for at least 6 months about a number of events or activities. Hypervigilance (e.g., feeling constantly on edge, experiencing concentration difficulties, having trouble falling or staying asleep, exhibiting a general state of irritability). Motor tension (e.g., restlessness, tiredness, shakiness, muscle tension). Problems Addressed  Anxiety Goals 1. Enhance ability to effectively cope with the full variety of life's worries and anxieties. 2. Learn and implement coping skills that result in a reduction of anxiety and worry, and improved daily functioning. Objective Learn to accept limitations in life and commit to tolerating, rather than avoiding, unpleasant emotions while accomplishing meaningful goals. Target Date: 2022-10-28 Frequency: Biweekly Progress: 30 Modality: individual Related Interventions 1. Use techniques from Acceptance and Commitment Therapy to help client accept uncomfortable realities such as lack of complete control, imperfections, and uncertainty and tolerate unpleasant emotions and thoughts in order to accomplish value-consistent goals. Objective Learn and implement problem-solving strategies for realistically addressing worries. Target Date: 2022-10-28 Frequency: Biweekly Progress: 30 Modality: individual Related Interventions 1. Assign the client a homework exercise in which he/she problem-solves a current problem.  review, reinforce success, and provide corrective feedback toward improvement. 2. Teach the client problem-solving strategies involving specifically defining a problem, generating options for addressing it,  evaluating the pros and cons of each option, selecting and implementing an optional action, and reevaluating and refining the action. Objective Learn and implement calming skills to reduce overall anxiety and manage anxiety symptoms. Target Date: 2022-10-28 Frequency: Biweekly Progress: 30 Modality: individual Related Interventions 1. Assign the client to read about progressive muscle relaxation and other calming strategies in relevant books or treatment manuals (e.g., Progressive Relaxation Training by Robb Matar and Alen Blew; Mastery of Your Anxiety and Worry: Workbook by Earlie Counts). 2. Assign the client homework each session in which he/she practices relaxation exercises daily, gradually applying them progressively from non-anxiety-provoking to anxiety-provoking situations; review and reinforce success while providing corrective feedback toward improvement. 3. Teach the client calming/relaxation skills (e.g., applied relaxation, progressive muscle relaxation, cue controlled relaxation; mindful breathing; biofeedback) and how to discriminate better between relaxation and tension; teach the client how to apply these skills to his/her daily life. 3. Reduce overall frequency, intensity, and duration of the anxiety so that daily functioning is not impaired. 4. Resolve the core conflict that is the source of anxiety. 5. Stabilize anxiety level while increasing ability to function on a daily basis. Diagnosis :    F41.1  Generalized Anxiety Disorder  Conditions For Discharge Achievement of treatment goals and objectives.   Ahmaud Duthie, LCSW

## 2022-04-07 ENCOUNTER — Ambulatory Visit (INDEPENDENT_AMBULATORY_CARE_PROVIDER_SITE_OTHER): Payer: BC Managed Care – PPO | Admitting: Psychology

## 2022-04-07 DIAGNOSIS — F411 Generalized anxiety disorder: Secondary | ICD-10-CM

## 2022-04-07 NOTE — Progress Notes (Signed)
[facility] Counselor/Therapist Progress Note  Patient ID: Heather Wolfe, MRN: [id number],    Date: 04/07/2022  Time Spent: 11:00am-11:55am   55 minutes   Treatment Type: Individual Therapy  Reported Symptoms: stress  Mental Status Exam: Appearance:  Casual     Behavior: Appropriate  Motor: Normal  Speech/Language:  Normal Rate  Affect: Appropriate  Mood: normal  Thought process: normal  Thought content:   WNL  Sensory/Perceptual disturbances:   WNL  Orientation: oriented to person, place, time/date, and situation  Attention: Good  Concentration: Good  Memory: WNL  Fund of knowledge:  Good  Insight:   Good  Judgment:  Good  Impulse Control: Good   Risk Assessment: Danger to Self:  No Self-injurious Behavior: No Danger to Others: No Duty to Warn:no Physical Aggression / Violence:No  Access to Firearms a concern: No  Gang Involvement:No   Subjective: Pt present for face-to-face individual therapy via Webex.   Pt consents to telehealth video therapy due to COVID 19 pandemic. Location of pt: home. Location of therapist: home office.   Pt talked about work.  She has been very busy.  Addressed the stress at work.   Worked on [employer]ician, dispensing.   Pt states she is frustrated that she has so much to do and at times she can't rely on others to do what needs to be done bc they are understaffed.   Pt gets very overwhelmed with personal life as well and does not always know how to ask for what she needs.   Addressed how pt can ask for help when needed.  Addressed the negative self talk and unrealistic expectations pt has of herself.  Worked on thought reframing.   Provided supportive therapy.    Interventions: Cognitive Behavioral Therapy and Insight-Oriented  Diagnosis:  F41.1  Plan:   Plan to continue to meet every two weeks.   Pt is progressing toward her treatment goals.    Treatment Plan   (Treatment Plan target date:  10/28/2022) Client  Abilities/Strengths  Pt is bright, engaging and motivated for therapy.  Client Treatment Preferences  Individual therapy.  Client Statement of Needs  Improve coping skills.  Symptoms  Autonomic hyperactivity (e.g., palpitations, shortness of breath, dry mouth, trouble swallowing, nausea, diarrhea). Excessive and/or unrealistic worry that is difficult to control occurring more days than not for at least 6 months about a number of events or activities. Hypervigilance (e.g., feeling constantly on edge, experiencing concentration difficulties, having trouble falling or staying asleep, exhibiting a general state of irritability). Motor tension (e.g., restlessness, tiredness, shakiness, muscle tension). Problems Addressed  Anxiety Goals 1. Enhance ability to effectively cope with the full variety of life's worries and anxieties. 2. Learn and implement coping skills that result in a reduction of anxiety and worry, and improved daily functioning. Objective Learn to accept limitations in life and commit to tolerating, rather than avoiding, unpleasant emotions while accomplishing meaningful goals. Target Date: 2022-10-28 Frequency: Biweekly Progress: 30 Modality: individual Related Interventions 1. Use techniques from Acceptance and Commitment Therapy to help client accept uncomfortable realities such as lack of complete control, imperfections, and uncertainty and tolerate unpleasant emotions and thoughts in order to accomplish value-consistent goals. Objective Learn and implement problem-solving strategies for realistically addressing worries. Target Date: 2022-10-28 Frequency: Biweekly Progress: 30 Modality: individual Related Interventions 1. Assign the client a homework exercise in which he/she problem-solves a current problem.  review, reinforce success, and provide corrective feedback toward improvement. 2. Teach the client problem-solving strategies involving specifically  defining a problem,  generating options for addressing it, evaluating the pros and cons of each option, selecting and implementing an optional action, and reevaluating and refining the action. Objective Learn and implement calming skills to reduce overall anxiety and manage anxiety symptoms. Target Date: 2022-10-28 Frequency: Biweekly Progress: 30 Modality: individual Related Interventions 1. Assign the client to read about progressive muscle relaxation and other calming strategies in relevant books or treatment manuals (e.g., Progressive Relaxation Training by Robb Matar and Alen Blew; Mastery of Your Anxiety and Worry: Workbook by Earlie Counts). 2. Assign the client homework each session in which he/she practices relaxation exercises daily, gradually applying them progressively from non-anxiety-provoking to anxiety-provoking situations; review and reinforce success while providing corrective feedback toward improvement. 3. Teach the client calming/relaxation skills (e.g., applied relaxation, progressive muscle relaxation, cue controlled relaxation; mindful breathing; biofeedback) and how to discriminate better between relaxation and tension; teach the client how to apply these skills to his/her daily life. 3. Reduce overall frequency, intensity, and duration of the anxiety so that daily functioning is not impaired. 4. Resolve the core conflict that is the source of anxiety. 5. Stabilize anxiety level while increasing ability to function on a daily basis. Diagnosis :    F41.1  Generalized Anxiety Disorder  Conditions For Discharge Achievement of treatment goals and objectives.   Courtney Fenlon, LCSW

## 2022-04-20 ENCOUNTER — Ambulatory Visit (INDEPENDENT_AMBULATORY_CARE_PROVIDER_SITE_OTHER): Payer: BC Managed Care – PPO | Admitting: Psychology

## 2022-04-20 DIAGNOSIS — F411 Generalized anxiety disorder: Secondary | ICD-10-CM | POA: Diagnosis not present

## 2022-04-20 NOTE — Progress Notes (Signed)
[facility] Counselor/Therapist Progress Note  Patient ID: Schae Cando, MRN: [id number],    Date: 04/20/2022  Time Spent: 12:00pm-12:50pm    50 minutes   Treatment Type: Individual Therapy  Reported Symptoms: stress  Mental Status Exam: Appearance:  Casual     Behavior: Appropriate  Motor: Normal  Speech/Language:  Normal Rate  Affect: Appropriate  Mood: normal  Thought process: normal  Thought content:   WNL  Sensory/Perceptual disturbances:   WNL  Orientation: oriented to person, place, time/date, and situation  Attention: Good  Concentration: Good  Memory: WNL  Fund of knowledge:  Good  Insight:   Good  Judgment:  Good  Impulse Control: Good   Risk Assessment: Danger to Self:  No Self-injurious Behavior: No Danger to Others: No Duty to Warn:no Physical Aggression / Violence:No  Access to Firearms a concern: No  Gang Involvement:No   Subjective: Pt present for face-to-face individual therapy via Webex.   Pt consents to telehealth video therapy due to COVID 19 pandemic. Location of pt: home. Location of therapist: home office.   Pt talked about work.  She has been very busy.  Addressed the stress at work.   Worked on [employer]ician, dispensing.  Pt is working on updating her resume to look into different employment options.  Pt states her main stressor is work but there are many things that make her so tired bc of being in graduate school and having a busy social schedule.    Pt gets very overwhelmed with personal life as well and does not always know how to ask for what she needs.   Addressed how pt can ask for help when needed.   Helped pt prioritize her tasks and social events.   Addressed what she can "let go" of.  Addressed the negative self talk and unrealistic expectations pt has of herself.  Worked on thought reframing.   Provided supportive therapy.    Interventions: Cognitive Behavioral Therapy and Insight-Oriented  Diagnosis:  F41.1  Plan:    Plan to continue to meet every two weeks.   Pt is progressing toward her treatment goals.    Treatment Plan   (Treatment Plan target date:  10/28/2022) Client Abilities/Strengths  Pt is bright, engaging and motivated for therapy.  Client Treatment Preferences  Individual therapy.  Client Statement of Needs  Improve coping skills.  Symptoms  Autonomic hyperactivity (e.g., palpitations, shortness of breath, dry mouth, trouble swallowing, nausea, diarrhea). Excessive and/or unrealistic worry that is difficult to control occurring more days than not for at least 6 months about a number of events or activities. Hypervigilance (e.g., feeling constantly on edge, experiencing concentration difficulties, having trouble falling or staying asleep, exhibiting a general state of irritability). Motor tension (e.g., restlessness, tiredness, shakiness, muscle tension). Problems Addressed  Anxiety Goals 1. Enhance ability to effectively cope with the full variety of life's worries and anxieties. 2. Learn and implement coping skills that result in a reduction of anxiety and worry, and improved daily functioning. Objective Learn to accept limitations in life and commit to tolerating, rather than avoiding, unpleasant emotions while accomplishing meaningful goals. Target Date: 2022-10-28 Frequency: Biweekly Progress: 30 Modality: individual Related Interventions 1. Use techniques from Acceptance and Commitment Therapy to help client accept uncomfortable realities such as lack of complete control, imperfections, and uncertainty and tolerate unpleasant emotions and thoughts in order to accomplish value-consistent goals. Objective Learn and implement problem-solving strategies for realistically addressing worries. Target Date: 2022-10-28 Frequency: Biweekly Progress: 30 Modality: individual Related Interventions  1. Assign the client a homework exercise in which he/she problem-solves a current problem.  review,  reinforce success, and provide corrective feedback toward improvement. 2. Teach the client problem-solving strategies involving specifically defining a problem, generating options for addressing it, evaluating the pros and cons of each option, selecting and implementing an optional action, and reevaluating and refining the action. Objective Learn and implement calming skills to reduce overall anxiety and manage anxiety symptoms. Target Date: 2022-10-28 Frequency: Biweekly Progress: 30 Modality: individual Related Interventions 1. Assign the client to read about progressive muscle relaxation and other calming strategies in relevant books or treatment manuals (e.g., Progressive Relaxation Training by Robb Matar and Alen Blew; Mastery of Your Anxiety and Worry: Workbook by Earlie Counts). 2. Assign the client homework each session in which he/she practices relaxation exercises daily, gradually applying them progressively from non-anxiety-provoking to anxiety-provoking situations; review and reinforce success while providing corrective feedback toward improvement. 3. Teach the client calming/relaxation skills (e.g., applied relaxation, progressive muscle relaxation, cue controlled relaxation; mindful breathing; biofeedback) and how to discriminate better between relaxation and tension; teach the client how to apply these skills to his/her daily life. 3. Reduce overall frequency, intensity, and duration of the anxiety so that daily functioning is not impaired. 4. Resolve the core conflict that is the source of anxiety. 5. Stabilize anxiety level while increasing ability to function on a daily basis. Diagnosis :    F41.1  Generalized Anxiety Disorder  Conditions For Discharge Achievement of treatment goals and objectives.   Zi Sek, LCSW

## 2022-05-11 ENCOUNTER — Ambulatory Visit (INDEPENDENT_AMBULATORY_CARE_PROVIDER_SITE_OTHER): Payer: BC Managed Care – PPO | Admitting: Psychology

## 2022-05-11 DIAGNOSIS — F411 Generalized anxiety disorder: Secondary | ICD-10-CM | POA: Diagnosis not present

## 2022-05-11 NOTE — Progress Notes (Signed)
[facility] Counselor/Therapist Progress Note  Patient ID: Heather Wolfe, MRN: [id number],    Date: 05/11/2022  Time Spent: 10:00am-10:55am      55 minutes   Treatment Type: Individual Therapy  Reported Symptoms: stress  Mental Status Exam: Appearance:  Casual     Behavior: Appropriate  Motor: Normal  Speech/Language:  Normal Rate  Affect: Appropriate  Mood: normal  Thought process: normal  Thought content:   WNL  Sensory/Perceptual disturbances:   WNL  Orientation: oriented to person, place, time/date, and situation  Attention: Good  Concentration: Good  Memory: WNL  Fund of knowledge:  Good  Insight:   Good  Judgment:  Good  Impulse Control: Good   Risk Assessment: Danger to Self:  No Self-injurious Behavior: No Danger to Others: No Duty to Warn:no Physical Aggression / Violence:No  Access to Firearms a concern: No  Gang Involvement:No   Subjective: Pt present for face-to-face individual therapy via Webex.   Pt consents to telehealth video therapy due to COVID 19 pandemic. Location of pt: home. Location of therapist: home office.   Pt talked about feeling overwhelmed bc she is behind in her school work.  Pt feels like she procrastinated this past weekend and is giving herself a hard time and feelings down.  Identified that pt's whole family is critical and her father was very critical of her for not having her resume updated.  Addressed how pt's negative thoughts come from her family messages.   Addressed the negative self talk and unrealistic expectations pt has of herself.  Worked on thought reframing.   Pt talked about her relationship.  They are talking about getting engaged.   Pt states the relationship is going well.   Provided supportive therapy.    Interventions: Cognitive Behavioral Therapy and Insight-Oriented  Diagnosis:  F41.1  Plan:   Plan to continue to meet every two weeks.   Pt is progressing toward her treatment goals.     Treatment Plan   (Treatment Plan target date:  10/28/2022) Client Abilities/Strengths  Pt is bright, engaging and motivated for therapy.  Client Treatment Preferences  Individual therapy.  Client Statement of Needs  Improve coping skills.  Symptoms  Autonomic hyperactivity (e.g., palpitations, shortness of breath, dry mouth, trouble swallowing, nausea, diarrhea). Excessive and/or unrealistic worry that is difficult to control occurring more days than not for at least 6 months about a number of events or activities. Hypervigilance (e.g., feeling constantly on edge, experiencing concentration difficulties, having trouble falling or staying asleep, exhibiting a general state of irritability). Motor tension (e.g., restlessness, tiredness, shakiness, muscle tension). Problems Addressed  Anxiety Goals 1. Enhance ability to effectively cope with the full variety of life's worries and anxieties. 2. Learn and implement coping skills that result in a reduction of anxiety and worry, and improved daily functioning. Objective Learn to accept limitations in life and commit to tolerating, rather than avoiding, unpleasant emotions while accomplishing meaningful goals. Target Date: 2022-10-28 Frequency: Biweekly Progress: 30 Modality: individual Related Interventions 1. Use techniques from Acceptance and Commitment Therapy to help client accept uncomfortable realities such as lack of complete control, imperfections, and uncertainty and tolerate unpleasant emotions and thoughts in order to accomplish value-consistent goals. Objective Learn and implement problem-solving strategies for realistically addressing worries. Target Date: 2022-10-28 Frequency: Biweekly Progress: 30 Modality: individual Related Interventions 1. Assign the client a homework exercise in which he/she problem-solves a current problem.  review, reinforce success, and provide corrective feedback toward improvement. 2. Teach the client  problem-solving strategies involving specifically defining a problem, generating options for addressing it, evaluating the pros and cons of each option, selecting and implementing an optional action, and reevaluating and refining the action. Objective Learn and implement calming skills to reduce overall anxiety and manage anxiety symptoms. Target Date: 2022-10-28 Frequency: Biweekly Progress: 30 Modality: individual Related Interventions 1. Assign the client to read about progressive muscle relaxation and other calming strategies in relevant books or treatment manuals (e.g., Progressive Relaxation Training by Robb Matar and Alen Blew; Mastery of Your Anxiety and Worry: Workbook by Earlie Counts). 2. Assign the client homework each session in which he/she practices relaxation exercises daily, gradually applying them progressively from non-anxiety-provoking to anxiety-provoking situations; review and reinforce success while providing corrective feedback toward improvement. 3. Teach the client calming/relaxation skills (e.g., applied relaxation, progressive muscle relaxation, cue controlled relaxation; mindful breathing; biofeedback) and how to discriminate better between relaxation and tension; teach the client how to apply these skills to his/her daily life. 3. Reduce overall frequency, intensity, and duration of the anxiety so that daily functioning is not impaired. 4. Resolve the core conflict that is the source of anxiety. 5. Stabilize anxiety level while increasing ability to function on a daily basis. Diagnosis :    F41.1  Generalized Anxiety Disorder  Conditions For Discharge Achievement of treatment goals and objectives.   Heather Armetta, LCSW

## 2022-05-26 ENCOUNTER — Ambulatory Visit (INDEPENDENT_AMBULATORY_CARE_PROVIDER_SITE_OTHER): Payer: BC Managed Care – PPO | Admitting: Psychology

## 2022-05-26 DIAGNOSIS — F411 Generalized anxiety disorder: Secondary | ICD-10-CM

## 2022-05-26 NOTE — Progress Notes (Signed)
[facility] Counselor/Therapist Progress Note  Patient ID: Jennifier Smitherman, MRN: [id number],    Date: 05/26/2022  Time Spent: 11:00am-11:45am      45 minutes   Treatment Type: Individual Therapy  Reported Symptoms: stress  Mental Status Exam: Appearance:  Casual     Behavior: Appropriate  Motor: Normal  Speech/Language:  Normal Rate  Affect: Appropriate  Mood: normal  Thought process: normal  Thought content:   WNL  Sensory/Perceptual disturbances:   WNL  Orientation: oriented to person, place, time/date, and situation  Attention: Good  Concentration: Good  Memory: WNL  Fund of knowledge:  Good  Insight:   Good  Judgment:  Good  Impulse Control: Good   Risk Assessment: Danger to Self:  No Self-injurious Behavior: No Danger to Others: No Duty to Warn:no Physical Aggression / Violence:No  Access to Firearms a concern: No  Gang Involvement:No   Subjective: Pt present for face-to-face individual therapy via Webex.   Pt consents to telehealth video therapy due to COVID 19 pandemic. Location of pt: home. Location of therapist: home office.   Pt talked about not feeling well and staying home from work today.   It is positive that pt is taking care of herself by taking the day off.   Pt talked about work.  She has felt very overwhelmed and stressed.   Pt states she is feeling discouraged by things at work.  Worked on [employer].   Pt state she feels depressed at times.   Helped pt process her feelings.  Encouraged pt to increase self care.   Provided supportive therapy.    Interventions: Cognitive Behavioral Therapy and Insight-Oriented  Diagnosis:  F41.1  Plan:   Plan to continue to meet every two weeks.   Pt is progressing toward her treatment goals.    Treatment Plan   (Treatment Plan target date:  10/28/2022) Client Abilities/Strengths  Pt is bright, engaging and motivated for therapy.  Client Treatment Preferences  Individual therapy.   Client Statement of Needs  Improve coping skills.  Symptoms  Autonomic hyperactivity (e.g., palpitations, shortness of breath, dry mouth, trouble swallowing, nausea, diarrhea). Excessive and/or unrealistic worry that is difficult to control occurring more days than not for at least 6 months about a number of events or activities. Hypervigilance (e.g., feeling constantly on edge, experiencing concentration difficulties, having trouble falling or staying asleep, exhibiting a general state of irritability). Motor tension (e.g., restlessness, tiredness, shakiness, muscle tension). Problems Addressed  Anxiety Goals 1. Enhance ability to effectively cope with the full variety of life's worries and anxieties. 2. Learn and implement coping skills that result in a reduction of anxiety and worry, and improved daily functioning. Objective Learn to accept limitations in life and commit to tolerating, rather than avoiding, unpleasant emotions while accomplishing meaningful goals. Target Date: 2022-10-28 Frequency: Biweekly Progress: 30 Modality: individual Related Interventions 1. Use techniques from Acceptance and Commitment Therapy to help client accept uncomfortable realities such as lack of complete control, imperfections, and uncertainty and tolerate unpleasant emotions and thoughts in order to accomplish value-consistent goals. Objective Learn and implement problem-solving strategies for realistically addressing worries. Target Date: 2022-10-28 Frequency: Biweekly Progress: 30 Modality: individual Related Interventions 1. Assign the client a homework exercise in which he/she problem-solves a current problem.  review, reinforce success, and provide corrective feedback toward improvement. 2. Teach the client problem-solving strategies involving specifically defining a problem, generating options for addressing it, evaluating the pros and cons of each option, selecting and implementing an optional  action, and reevaluating and refining the action. Objective Learn and implement calming skills to reduce overall anxiety and manage anxiety symptoms. Target Date: 2022-10-28 Frequency: Biweekly Progress: 30 Modality: individual Related Interventions 1. Assign the client to read about progressive muscle relaxation and other calming strategies in relevant books or treatment manuals (e.g., Progressive Relaxation Training by Robb Matar and Alen Blew; Mastery of Your Anxiety and Worry: Workbook by Earlie Counts). 2. Assign the client homework each session in which he/she practices relaxation exercises daily, gradually applying them progressively from non-anxiety-provoking to anxiety-provoking situations; review and reinforce success while providing corrective feedback toward improvement. 3. Teach the client calming/relaxation skills (e.g., applied relaxation, progressive muscle relaxation, cue controlled relaxation; mindful breathing; biofeedback) and how to discriminate better between relaxation and tension; teach the client how to apply these skills to his/her daily life. 3. Reduce overall frequency, intensity, and duration of the anxiety so that daily functioning is not impaired. 4. Resolve the core conflict that is the source of anxiety. 5. Stabilize anxiety level while increasing ability to function on a daily basis. Diagnosis :    F41.1  Generalized Anxiety Disorder  Conditions For Discharge Achievement of treatment goals and objectives.   Seydou Hearns, LCSW

## 2022-06-11 ENCOUNTER — Ambulatory Visit (INDEPENDENT_AMBULATORY_CARE_PROVIDER_SITE_OTHER): Payer: BC Managed Care – PPO | Admitting: Psychology

## 2022-06-11 DIAGNOSIS — F411 Generalized anxiety disorder: Secondary | ICD-10-CM

## 2022-06-11 NOTE — Progress Notes (Signed)
[facility] Counselor/Therapist Progress Note  Patient ID: Heather Wolfe, MRN: [id number],    Date: 06/11/2022  Time Spent: 11:00am-11:55am      55 minutes   Treatment Type: Individual Therapy  Reported Symptoms: stress  Mental Status Exam: Appearance:  Casual     Behavior: Appropriate  Motor: Normal  Speech/Language:  Normal Rate  Affect: Appropriate  Mood: normal  Thought process: normal  Thought content:   WNL  Sensory/Perceptual disturbances:   WNL  Orientation: oriented to person, place, time/date, and situation  Attention: Good  Concentration: Good  Memory: WNL  Fund of knowledge:  Good  Insight:   Good  Judgment:  Good  Impulse Control: Good   Risk Assessment: Danger to Self:  No Self-injurious Behavior: No Danger to Others: No Duty to Warn:no Physical Aggression / Violence:No  Access to Firearms a concern: No  Gang Involvement:No   Subjective: Pt present for face-to-face individual therapy via Webex.   Pt consents to telehealth video therapy due to COVID 19 pandemic. Location of pt: home. Location of therapist: home office.   Pt talked about work.   Pt was on vacation last week so she missed some work drama which was good.   Pt dreaded going back to work after vacation bc of the work stress.   She cried on her way to work.  Systems processes at work are very disorganized and frustrating and pt does not have control over many of those things.   Addressed how pt can adjust her expectations and manage stress within a chaotic work environment.   Helped pt process her feelings.  Pt talked about her relationship with [name].  Their relationship is going well which pt feels positive about.  Pt has some worries about the future that she is trying to not worry about.   Encouraged pt to increase self care.   Provided supportive therapy.    Interventions: Cognitive Behavioral Therapy and Insight-Oriented  Diagnosis:  F41.1  Plan:   Plan to continue  to meet every two weeks.   Pt is progressing toward her treatment goals.    Treatment Plan   (Treatment Plan target date:  10/28/2022) Client Abilities/Strengths  Pt is bright, engaging and motivated for therapy.  Client Treatment Preferences  Individual therapy.  Client Statement of Needs  Improve coping skills.  Symptoms  Autonomic hyperactivity (e.g., palpitations, shortness of breath, dry mouth, trouble swallowing, nausea, diarrhea). Excessive and/or unrealistic worry that is difficult to control occurring more days than not for at least 6 months about a number of events or activities. Hypervigilance (e.g., feeling constantly on edge, experiencing concentration difficulties, having trouble falling or staying asleep, exhibiting a general state of irritability). Motor tension (e.g., restlessness, tiredness, shakiness, muscle tension). Problems Addressed  Anxiety Goals 1. Enhance ability to effectively cope with the full variety of life's worries and anxieties. 2. Learn and implement coping skills that result in a reduction of anxiety and worry, and improved daily functioning. Objective Learn to accept limitations in life and commit to tolerating, rather than avoiding, unpleasant emotions while accomplishing meaningful goals. Target Date: 2022-10-28 Frequency: Biweekly Progress: 30 Modality: individual Related Interventions 1. Use techniques from Acceptance and Commitment Therapy to help client accept uncomfortable realities such as lack of complete control, imperfections, and uncertainty and tolerate unpleasant emotions and thoughts in order to accomplish value-consistent goals. Objective Learn and implement problem-solving strategies for realistically addressing worries. Target Date: 2022-10-28 Frequency: Biweekly Progress: 30 Modality: individual Related Interventions 1. Assign the  client a homework exercise in which he/she problem-solves a current problem.  review, reinforce success,  and provide corrective feedback toward improvement. 2. Teach the client problem-solving strategies involving specifically defining a problem, generating options for addressing it, evaluating the pros and cons of each option, selecting and implementing an optional action, and reevaluating and refining the action. Objective Learn and implement calming skills to reduce overall anxiety and manage anxiety symptoms. Target Date: 2022-10-28 Frequency: Biweekly Progress: 30 Modality: individual Related Interventions 1. Assign the client to read about progressive muscle relaxation and other calming strategies in relevant books or treatment manuals (e.g., Progressive Relaxation Training by Robb Matar and Alen Blew; Mastery of Your Anxiety and Worry: Workbook by Earlie Counts). 2. Assign the client homework each session in which he/she practices relaxation exercises daily, gradually applying them progressively from non-anxiety-provoking to anxiety-provoking situations; review and reinforce success while providing corrective feedback toward improvement. 3. Teach the client calming/relaxation skills (e.g., applied relaxation, progressive muscle relaxation, cue controlled relaxation; mindful breathing; biofeedback) and how to discriminate better between relaxation and tension; teach the client how to apply these skills to his/her daily life. 3. Reduce overall frequency, intensity, and duration of the anxiety so that daily functioning is not impaired. 4. Resolve the core conflict that is the source of anxiety. 5. Stabilize anxiety level while increasing ability to function on a daily basis. Diagnosis :    F41.1  Generalized Anxiety Disorder  Conditions For Discharge Achievement of treatment goals and objectives.   Kelley Polinsky, LCSW

## 2022-06-22 ENCOUNTER — Ambulatory Visit (INDEPENDENT_AMBULATORY_CARE_PROVIDER_SITE_OTHER): Payer: BC Managed Care – PPO | Admitting: Psychology

## 2022-06-22 DIAGNOSIS — F411 Generalized anxiety disorder: Secondary | ICD-10-CM

## 2022-06-22 NOTE — Progress Notes (Signed)
[facility] Counselor/Therapist Progress Note  Patient ID: Jean Alejos, MRN: [id number],    Date: 06/22/2022  Time Spent: 11:00am-11:55am      55 minutes   Treatment Type: Individual Therapy  Reported Symptoms: stress  Mental Status Exam: Appearance:  Casual     Behavior: Appropriate  Motor: Normal  Speech/Language:  Normal Rate  Affect: Appropriate  Mood: normal  Thought process: normal  Thought content:   WNL  Sensory/Perceptual disturbances:   WNL  Orientation: oriented to person, place, time/date, and situation  Attention: Good  Concentration: Good  Memory: WNL  Fund of knowledge:  Good  Insight:   Good  Judgment:  Good  Impulse Control: Good   Risk Assessment: Danger to Self:  No Self-injurious Behavior: No Danger to Others: No Duty to Warn:no Physical Aggression / Violence:No  Access to Firearms a concern: No  Gang Involvement:No   Subjective: Pt present for face-to-face individual therapy via Webex.   Pt consents to telehealth video therapy due to COVID 19 pandemic. Location of pt: home. Location of therapist: home office.   Pt talked about work.  Pt is feeling a lot of stress bc of being short staffed.   Worked on [employer].   Pt has an interview for another job this week.   Pt has mobilized to do a job search.  She feels positive about getting her resume done and engaging in the job search even though it adds more stress.   Pt states her anxiety has been heightened recently.   It is a busy time in her [facility] [employer] [facility] program.  She is finishing up some classes. Pt talked about her relationship with [name] and his family.  Pt got upset about things [name]'s brother was saying.  Addressed the issues and how they impacted pt.   Helped pt process her feelings and relationship dynamics.   Addressed pt's worry thoughts and how they impact her stress and anxiety levels.  Addressed how pt worries about things that are beyond her control.  Worked on  thought reframing.  Encouraged pt to increase self care.   Provided supportive therapy.    Interventions: Cognitive Behavioral Therapy and Insight-Oriented  Diagnosis:  F41.1  Plan:   Plan to continue to meet every two weeks.   Pt is progressing toward her treatment goals.    Treatment Plan   (Treatment Plan target date:  10/28/2022) Client Abilities/Strengths  Pt is bright, engaging and motivated for therapy.  Client Treatment Preferences  Individual therapy.  Client Statement of Needs  Improve coping skills.  Symptoms  Autonomic hyperactivity (e.g., palpitations, shortness of breath, dry mouth, trouble swallowing, nausea, diarrhea). Excessive and/or unrealistic worry that is difficult to control occurring more days than not for at least 6 months about a number of events or activities. Hypervigilance (e.g., feeling constantly on edge, experiencing concentration difficulties, having trouble falling or staying asleep, exhibiting a general state of irritability). Motor tension (e.g., restlessness, tiredness, shakiness, muscle tension). Problems Addressed  Anxiety Goals 1. Enhance ability to effectively cope with the full variety of life's worries and anxieties. 2. Learn and implement coping skills that result in a reduction of anxiety and worry, and improved daily functioning. Objective Learn to accept limitations in life and commit to tolerating, rather than avoiding, unpleasant emotions while accomplishing meaningful goals. Target Date: 2022-10-28 Frequency: Biweekly Progress: 30 Modality: individual Related Interventions 1. Use techniques from Acceptance and Commitment Therapy to help client accept uncomfortable realities such as lack of complete control, imperfections,  and uncertainty and tolerate unpleasant emotions and thoughts in order to accomplish value-consistent goals. Objective Learn and implement problem-solving strategies for realistically addressing worries. Target Date:  2022-10-28 Frequency: Biweekly Progress: 30 Modality: individual Related Interventions 1. Assign the client a homework exercise in which he/she problem-solves a current problem.  review, reinforce success, and provide corrective feedback toward improvement. 2. Teach the client problem-solving strategies involving specifically defining a problem, generating options for addressing it, evaluating the pros and cons of each option, selecting and implementing an optional action, and reevaluating and refining the action. Objective Learn and implement calming skills to reduce overall anxiety and manage anxiety symptoms. Target Date: 2022-10-28 Frequency: Biweekly Progress: 30 Modality: individual Related Interventions 1. Assign the client to read about progressive muscle relaxation and other calming strategies in relevant books or treatment manuals (e.g., Progressive Relaxation Training by Gwynneth Aliment and Dani Gobble; Mastery of Your Anxiety and Worry: Workbook by Beckie Busing). 2. Assign the client homework each session in which he/she practices relaxation exercises daily, gradually applying them progressively from non-anxiety-provoking to anxiety-provoking situations; review and reinforce success while providing corrective feedback toward improvement. 3. Teach the client calming/relaxation skills (e.g., applied relaxation, progressive muscle relaxation, cue controlled relaxation; mindful breathing; biofeedback) and how to discriminate better between relaxation and tension; teach the client how to apply these skills to his/her daily life. 3. Reduce overall frequency, intensity, and duration of the anxiety so that daily functioning is not impaired. 4. Resolve the core conflict that is the source of anxiety. 5. Stabilize anxiety level while increasing ability to function on a daily basis. Diagnosis :    F41.1  Generalized Anxiety Disorder  Conditions For Discharge Achievement of treatment goals and  objectives.   Nera Haworth, LCSW

## 2022-07-06 ENCOUNTER — Ambulatory Visit (INDEPENDENT_AMBULATORY_CARE_PROVIDER_SITE_OTHER): Payer: BC Managed Care – PPO | Admitting: Psychology

## 2022-07-06 DIAGNOSIS — F411 Generalized anxiety disorder: Secondary | ICD-10-CM | POA: Diagnosis not present

## 2022-07-06 NOTE — Progress Notes (Signed)
[facility] Counselor/Therapist Progress Note  Patient ID: Heather Wolfe, MRN: [id number],    Date: 07/06/2022  Time Spent: 11:00am-11:55am      55 minutes   Treatment Type: Individual Therapy  Reported Symptoms: stress  Mental Status Exam: Appearance:  Casual     Behavior: Appropriate  Motor: Normal  Speech/Language:  Normal Rate  Affect: Appropriate  Mood: normal  Thought process: normal  Thought content:   WNL  Sensory/Perceptual disturbances:   WNL  Orientation: oriented to person, place, time/date, and situation  Attention: Good  Concentration: Good  Memory: WNL  Fund of knowledge:  Good  Insight:   Good  Judgment:  Good  Impulse Control: Good   Risk Assessment: Danger to Self:  No Self-injurious Behavior: No Danger to Others: No Duty to Warn:no Physical Aggression / Violence:No  Access to Firearms a concern: No  Gang Involvement:No   Subjective: Pt present for face-to-face individual therapy via Webex.   Pt consents to telehealth video therapy due to COVID 19 pandemic. Location of pt: home. Location of therapist: home office.   Pt talked about feeling down bc she has been applying for jobs and interviewing and nothing has worked out.  Pt then has worry thoughts that she will be "stuck in her current job forever".   Addressed pt's worry thoughts and how they impact her stress and anxiety levels.    Worked on thought reframing.  Addressed how pt tends to "globalize" things and get into catastrophic thinking.   Worked on how pt can let things go that are beyond her control.  Addressed how pt can use her faith in God to help her let go.  Encouraged pt to increase self care.   Provided supportive therapy.    Interventions: Cognitive Behavioral Therapy and Insight-Oriented  Diagnosis:  F41.1  Plan:   Plan to continue to meet every two weeks.   Pt is progressing toward her treatment goals.    Treatment Plan   (Treatment Plan target date:   10/28/2022) Client Abilities/Strengths  Pt is bright, engaging and motivated for therapy.  Client Treatment Preferences  Individual therapy.  Client Statement of Needs  Improve coping skills.  Symptoms  Autonomic hyperactivity (e.g., palpitations, shortness of breath, dry mouth, trouble swallowing, nausea, diarrhea). Excessive and/or unrealistic worry that is difficult to control occurring more days than not for at least 6 months about a number of events or activities. Hypervigilance (e.g., feeling constantly on edge, experiencing concentration difficulties, having trouble falling or staying asleep, exhibiting a general state of irritability). Motor tension (e.g., restlessness, tiredness, shakiness, muscle tension). Problems Addressed  Anxiety Goals 1. Enhance ability to effectively cope with the full variety of life's worries and anxieties. 2. Learn and implement coping skills that result in a reduction of anxiety and worry, and improved daily functioning. Objective Learn to accept limitations in life and commit to tolerating, rather than avoiding, unpleasant emotions while accomplishing meaningful goals. Target Date: 2022-10-28 Frequency: Biweekly Progress: 30 Modality: individual Related Interventions 1. Use techniques from Acceptance and Commitment Therapy to help client accept uncomfortable realities such as lack of complete control, imperfections, and uncertainty and tolerate unpleasant emotions and thoughts in order to accomplish value-consistent goals. Objective Learn and implement problem-solving strategies for realistically addressing worries. Target Date: 2022-10-28 Frequency: Biweekly Progress: 30 Modality: individual Related Interventions 1. Assign the client a homework exercise in which he/she problem-solves a current problem.  review, reinforce success, and provide corrective feedback toward improvement. 2. Teach the client problem-solving  strategies involving specifically  defining a problem, generating options for addressing it, evaluating the pros and cons of each option, selecting and implementing an optional action, and reevaluating and refining the action. Objective Learn and implement calming skills to reduce overall anxiety and manage anxiety symptoms. Target Date: 2022-10-28 Frequency: Biweekly Progress: 30 Modality: individual Related Interventions 1. Assign the client to read about progressive muscle relaxation and other calming strategies in relevant books or treatment manuals (e.g., Progressive Relaxation Training by Heather Wolfe and Dani Gobble; Mastery of Your Anxiety and Worry: Workbook by Heather Wolfe). 2. Assign the client homework each session in which he/she practices relaxation exercises daily, gradually applying them progressively from non-anxiety-provoking to anxiety-provoking situations; review and reinforce success while providing corrective feedback toward improvement. 3. Teach the client calming/relaxation skills (e.g., applied relaxation, progressive muscle relaxation, cue controlled relaxation; mindful breathing; biofeedback) and how to discriminate better between relaxation and tension; teach the client how to apply these skills to his/her daily life. 3. Reduce overall frequency, intensity, and duration of the anxiety so that daily functioning is not impaired. 4. Resolve the core conflict that is the source of anxiety. 5. Stabilize anxiety level while increasing ability to function on a daily basis. Diagnosis :    F41.1  Generalized Anxiety Disorder  Conditions For Discharge Achievement of treatment goals and objectives.   Heather Krusemark, LCSW

## 2022-07-20 ENCOUNTER — Ambulatory Visit (INDEPENDENT_AMBULATORY_CARE_PROVIDER_SITE_OTHER): Payer: BC Managed Care – PPO | Admitting: Psychology

## 2022-07-20 DIAGNOSIS — F411 Generalized anxiety disorder: Secondary | ICD-10-CM

## 2022-07-20 NOTE — Progress Notes (Signed)
[facility] Counselor/Therapist Progress Note  Patient ID: Nandita Mathenia, MRN: [id number],    Date: 07/20/2022  Time Spent: 11:00am-11:55am      55 minutes   Treatment Type: Individual Therapy  Reported Symptoms: stress, anxiety  Mental Status Exam: Appearance:  Casual     Behavior: Appropriate  Motor: Normal  Speech/Language:  Normal Rate  Affect: Appropriate  Mood: normal  Thought process: normal  Thought content:   WNL  Sensory/Perceptual disturbances:   WNL  Orientation: oriented to person, place, time/date, and situation  Attention: Good  Concentration: Good  Memory: WNL  Fund of knowledge:  Good  Insight:   Good  Judgment:  Good  Impulse Control: Good   Risk Assessment: Danger to Self:  No Self-injurious Behavior: No Danger to Others: No Duty to Warn:no Physical Aggression / Violence:No  Access to Firearms a concern: No  Gang Involvement:No   Subjective: Pt present for face-to-face individual therapy via Webex.   Pt consents to telehealth video therapy due to COVID 19 pandemic. Location of pt: home. Location of therapist: home office.   Pt talked about her relationship with [name].   [name] asked pt's father for permission to marry her.   Pt's father agreed but then expressed concerns to pt's sister.   Pt's father does not feel like Kirk Ruths is ambitious enough.  Pt's sister has some concerns about [name] as well.  Helped pt process her feelings about her family's thoughts and feelings about [name].   Addressed how pt can have a conversation with her family about their concerns.   Pt has been worrying about what her family thinks of [name] and what [name]'s family thinks about her.   She has worried so much that she had trouble sleeping last night.  Addressed pt's worry thoughts.  Worked on thought reframing.    Addressed how pt tends to "globalize" things and get into catastrophic thinking.   Worked on how pt can let things go that are beyond her  control.   Encouraged pt to increase self care.   Pt talked about work and having two job interviews this morning.  Addressed the stress pt is experiencing.   Provided supportive therapy.    Interventions: Cognitive Behavioral Therapy and Insight-Oriented  Diagnosis:  F41.1  Plan:   Plan to continue to meet every two weeks.   Pt is progressing toward her treatment goals.    Treatment Plan   (Treatment Plan target date:  10/28/2022) Client Abilities/Strengths  Pt is bright, engaging and motivated for therapy.  Client Treatment Preferences  Individual therapy.  Client Statement of Needs  Improve coping skills.  Symptoms  Autonomic hyperactivity (e.g., palpitations, shortness of breath, dry mouth, trouble swallowing, nausea, diarrhea). Excessive and/or unrealistic worry that is difficult to control occurring more days than not for at least 6 months about a number of events or activities. Hypervigilance (e.g., feeling constantly on edge, experiencing concentration difficulties, having trouble falling or staying asleep, exhibiting a general state of irritability). Motor tension (e.g., restlessness, tiredness, shakiness, muscle tension). Problems Addressed  Anxiety Goals 1. Enhance ability to effectively cope with the full variety of life's worries and anxieties. 2. Learn and implement coping skills that result in a reduction of anxiety and worry, and improved daily functioning. Objective Learn to accept limitations in life and commit to tolerating, rather than avoiding, unpleasant emotions while accomplishing meaningful goals. Target Date: 2022-10-28 Frequency: Biweekly Progress: 30 Modality: individual Related Interventions 1. Use techniques from Acceptance and Commitment Therapy  to help client accept uncomfortable realities such as lack of complete control, imperfections, and uncertainty and tolerate unpleasant emotions and thoughts in order to accomplish value-consistent  goals. Objective Learn and implement problem-solving strategies for realistically addressing worries. Target Date: 2022-10-28 Frequency: Biweekly Progress: 30 Modality: individual Related Interventions 1. Assign the client a homework exercise in which he/she problem-solves a current problem.  review, reinforce success, and provide corrective feedback toward improvement. 2. Teach the client problem-solving strategies involving specifically defining a problem, generating options for addressing it, evaluating the pros and cons of each option, selecting and implementing an optional action, and reevaluating and refining the action. Objective Learn and implement calming skills to reduce overall anxiety and manage anxiety symptoms. Target Date: 2022-10-28 Frequency: Biweekly Progress: 30 Modality: individual Related Interventions 1. Assign the client to read about progressive muscle relaxation and other calming strategies in relevant books or treatment manuals (e.g., Progressive Relaxation Training by Robb Matar and Alen Blew; Mastery of Your Anxiety and Worry: Workbook by Earlie Counts). 2. Assign the client homework each session in which he/she practices relaxation exercises daily, gradually applying them progressively from non-anxiety-provoking to anxiety-provoking situations; review and reinforce success while providing corrective feedback toward improvement. 3. Teach the client calming/relaxation skills (e.g., applied relaxation, progressive muscle relaxation, cue controlled relaxation; mindful breathing; biofeedback) and how to discriminate better between relaxation and tension; teach the client how to apply these skills to his/her daily life. 3. Reduce overall frequency, intensity, and duration of the anxiety so that daily functioning is not impaired. 4. Resolve the core conflict that is the source of anxiety. 5. Stabilize anxiety level while increasing ability to function on a daily  basis. Diagnosis :    F41.1  Generalized Anxiety Disorder  Conditions For Discharge Achievement of treatment goals and objectives.   Vaidehi Braddy, LCSW

## 2022-08-03 ENCOUNTER — Ambulatory Visit (INDEPENDENT_AMBULATORY_CARE_PROVIDER_SITE_OTHER): Payer: BC Managed Care – PPO | Admitting: Psychology

## 2022-08-03 DIAGNOSIS — F411 Generalized anxiety disorder: Secondary | ICD-10-CM

## 2022-08-03 NOTE — Progress Notes (Signed)
[facility] Counselor/Therapist Progress Note  Patient ID: Heather Wolfe, MRN: [id number],    Date: 08/03/2022  Time Spent: 11:00am-11:50am      50 minutes   Treatment Type: Individual Therapy  Reported Symptoms: stress, anxiety  Mental Status Exam: Appearance:  Casual     Behavior: Appropriate  Motor: Normal  Speech/Language:  Normal Rate  Affect: Appropriate  Mood: normal  Thought process: normal  Thought content:   WNL  Sensory/Perceptual disturbances:   WNL  Orientation: oriented to person, place, time/date, and situation  Attention: Good  Concentration: Good  Memory: WNL  Fund of knowledge:  Good  Insight:   Good  Judgment:  Good  Impulse Control: Good   Risk Assessment: Danger to Self:  No Self-injurious Behavior: No Danger to Others: No Duty to Warn:no Physical Aggression / Violence:No  Access to Firearms a concern: No  Gang Involvement:No   Subjective: Pt present for face-to-face individual therapy via Webex.   Pt consents to telehealth video therapy due to COVID 19 pandemic. Location of pt: home. Location of therapist: home office.   Pt talked about feeling very anxious bc she is applying for so many jobs that she can't keep them straight in interviews.  Addressed pt's stress and helped her problem solve.   Identified that pt may want to decrease her job search for now.   Pt feels like she "can't handle things" if she can't "do it all." Addressed pt's worry thoughts.  Worked on thought reframing.    Addressed how pt tends to "globalize" things and get into catastrophic thinking.   Worked on how pt can let things go that are beyond her control.   Encouraged pt to increase self care.     Provided supportive therapy.    Interventions: Cognitive Behavioral Therapy and Insight-Oriented  Diagnosis:  F41.1  Plan:   Plan to continue to meet every two weeks.   Pt is progressing toward her treatment goals.    Treatment Plan   (Treatment Plan  target date:  10/28/2022) Client Abilities/Strengths  Pt is bright, engaging and motivated for therapy.  Client Treatment Preferences  Individual therapy.  Client Statement of Needs  Improve coping skills.  Symptoms  Autonomic hyperactivity (e.g., palpitations, shortness of breath, dry mouth, trouble swallowing, nausea, diarrhea). Excessive and/or unrealistic worry that is difficult to control occurring more days than not for at least 6 months about a number of events or activities. Hypervigilance (e.g., feeling constantly on edge, experiencing concentration difficulties, having trouble falling or staying asleep, exhibiting a general state of irritability). Motor tension (e.g., restlessness, tiredness, shakiness, muscle tension). Problems Addressed  Anxiety Goals 1. Enhance ability to effectively cope with the full variety of life's worries and anxieties. 2. Learn and implement coping skills that result in a reduction of anxiety and worry, and improved daily functioning. Objective Learn to accept limitations in life and commit to tolerating, rather than avoiding, unpleasant emotions while accomplishing meaningful goals. Target Date: 2022-10-28 Frequency: Biweekly Progress: 30 Modality: individual Related Interventions 1. Use techniques from Acceptance and Commitment Therapy to help client accept uncomfortable realities such as lack of complete control, imperfections, and uncertainty and tolerate unpleasant emotions and thoughts in order to accomplish value-consistent goals. Objective Learn and implement problem-solving strategies for realistically addressing worries. Target Date: 2022-10-28 Frequency: Biweekly Progress: 30 Modality: individual Related Interventions 1. Assign the client a homework exercise in which he/she problem-solves a current problem.  review, reinforce success, and provide corrective feedback toward improvement. 2. Teach  the client problem-solving strategies involving  specifically defining a problem, generating options for addressing it, evaluating the pros and cons of each option, selecting and implementing an optional action, and reevaluating and refining the action. Objective Learn and implement calming skills to reduce overall anxiety and manage anxiety symptoms. Target Date: 2022-10-28 Frequency: Biweekly Progress: 30 Modality: individual Related Interventions 1. Assign the client to read about progressive muscle relaxation and other calming strategies in relevant books or treatment manuals (e.g., Progressive Relaxation Training by Robb Matar and Alen Blew; Mastery of Your Anxiety and Worry: Workbook by Earlie Counts). 2. Assign the client homework each session in which he/she practices relaxation exercises daily, gradually applying them progressively from non-anxiety-provoking to anxiety-provoking situations; review and reinforce success while providing corrective feedback toward improvement. 3. Teach the client calming/relaxation skills (e.g., applied relaxation, progressive muscle relaxation, cue controlled relaxation; mindful breathing; biofeedback) and how to discriminate better between relaxation and tension; teach the client how to apply these skills to his/her daily life. 3. Reduce overall frequency, intensity, and duration of the anxiety so that daily functioning is not impaired. 4. Resolve the core conflict that is the source of anxiety. 5. Stabilize anxiety level while increasing ability to function on a daily basis. Diagnosis :    F41.1  Generalized Anxiety Disorder  Conditions For Discharge Achievement of treatment goals and objectives.   Devery Murgia, LCSW

## 2022-08-13 ENCOUNTER — Encounter: Payer: Self-pay | Admitting: Medical-Surgical

## 2022-08-13 ENCOUNTER — Ambulatory Visit (INDEPENDENT_AMBULATORY_CARE_PROVIDER_SITE_OTHER): Payer: BC Managed Care – PPO

## 2022-08-13 ENCOUNTER — Ambulatory Visit: Payer: BC Managed Care – PPO | Admitting: Medical-Surgical

## 2022-08-13 VITALS — BP 117/83 | HR 99 | Wt 138.0 lb

## 2022-08-13 DIAGNOSIS — Z113 Encounter for screening for infections with a predominantly sexual mode of transmission: Secondary | ICD-10-CM | POA: Diagnosis not present

## 2022-08-13 DIAGNOSIS — R300 Dysuria: Secondary | ICD-10-CM

## 2022-08-13 DIAGNOSIS — R319 Hematuria, unspecified: Secondary | ICD-10-CM

## 2022-08-13 DIAGNOSIS — R1032 Left lower quadrant pain: Secondary | ICD-10-CM

## 2022-08-13 LAB — WET PREP FOR TRICH, YEAST, CLUE
MICRO NUMBER:: 14199059
Specimen Quality: ADEQUATE

## 2022-08-13 LAB — POCT URINALYSIS DIP (CLINITEK)
Bilirubin, UA: NEGATIVE
Blood, UA: NEGATIVE
Glucose, UA: NEGATIVE mg/dL
Ketones, POC UA: NEGATIVE mg/dL
Leukocytes, UA: NEGATIVE
Nitrite, UA: NEGATIVE
POC PROTEIN,UA: NEGATIVE
Spec Grav, UA: >=1.03 — AB (ref 1.010–1.025)
Urobilinogen, UA: 0.2 E.U./dL
pH, UA: 6 (ref 5.0–8.0)

## 2022-08-13 NOTE — Progress Notes (Signed)
Established Patient Office Visit  Subjective   Patient ID: Heather Wolfe, female    DOB: Aug 09, 1996  Age: 26 y.o. MRN: [id number]  Chief Complaint  Patient presents with   Urinary Tract Infection    HPI  Heather Wolfe presents to clinic today for ongoing painful urination along with blood in urine, some intermittent nausea, left lower quadrant and left flank pain. She reports that she did a virtual visit and and was prescribed Nitrofurantoin 100 mg, twice daily. She finished this course but her symptoms are unresolved. She reports that she is on birth control and does not get monthly period, she denies any problem with bowel movement. She denies any risk for STI and has a monogamous relationship with a female partner. She denies any history of kidney stone(s).   Review of Systems  Constitutional: Negative.   Respiratory: Negative.    Cardiovascular: Negative.   Gastrointestinal:  Positive for nausea.  Genitourinary:  Positive for dysuria, flank pain, frequency, hematuria and urgency.      Objective:     BP 117/83   Pulse 99   Wt 62.6 kg   SpO2 99%   BMI 23.69 kg/m    Physical Exam Constitutional:      Appearance: Normal appearance. She is normal weight.  HENT:     Head: Normocephalic and atraumatic.  Cardiovascular:     Rate and Rhythm: Normal rate and regular rhythm.     Pulses: Normal pulses.     Heart sounds: Normal heart sounds.  Pulmonary:     Effort: Pulmonary effort is normal.     Breath sounds: Normal breath sounds.  Abdominal:     General: Abdomen is flat. Bowel sounds are normal. There is no distension.     Palpations: Abdomen is soft. There is no mass.     Tenderness: There is no abdominal tenderness. There is no right CVA tenderness, left CVA tenderness, guarding or rebound.     Hernia: No hernia is present.  Skin:    General: Skin is warm and dry.     Capillary Refill: Capillary refill takes less than 2 seconds.  Neurological:     General: No focal  deficit present.     Mental Status: She is alert and oriented to person, place, and time. Mental status is at baseline.  Psychiatric:        Mood and Affect: Mood normal.        Behavior: Behavior normal.        Thought Content: Thought content normal.        Judgment: Judgment normal.      No results found for any visits on 08/13/22.    The ASCVD Risk score (Arnett DK, et al., 2019) failed to calculate for the following reasons:   The 2019 ASCVD risk score is only valid for ages 4 to 84    Assessment & Plan:   1. Dysuria Her urinalysis was negative with increased specific gravity.  We discussed with her the other potential condition that could be causing her symptoms. We will go ahead and do vaginal self-swab test for other pathogens that can cause her symptoms. We will also send her to radiology for Abd XRAY to see if there maybe any kidney stone present. Otherwise, we instructed her to drink plenty of fluids, strain her urine and she may use OTC ibuprofen and acetaminophen as needed for pain.   - POCT URINALYSIS DIP (CLINITEK) - WET PREP FOR TRICH, YEAST, CLUE - DG Abd  1 View; Future  2. Routine screening for STI (sexually transmitted infection) Given the negative urinalysis with increased specific gravity, we went ahead and test her for routine sexually transmitted infections.  - C. trachomatis/N. gonorrhoeae RNA  Return if symptoms worsen or fail to improve.    Loretha Stapler, RN Student-NP

## 2022-08-13 NOTE — Progress Notes (Signed)
Medical screening examination/treatment was performed by qualified nurse practitioner student and as supervising provider I was immediately available for consultation/collaboration. I have reviewed documentation and agree with assessment and plan. ° °Trinten Boudoin L. Alika Saladin, DNP, APRN, FNP-BC °[facility] °Primary Care and Sports Medicine ° °

## 2022-08-14 LAB — C. TRACHOMATIS/N. GONORRHOEAE RNA
C. trachomatis RNA, TMA: NOT DETECTED
N. gonorrhoeae RNA, TMA: NOT DETECTED

## 2022-08-17 ENCOUNTER — Encounter: Payer: Self-pay | Admitting: Medical-Surgical

## 2022-08-17 ENCOUNTER — Ambulatory Visit (INDEPENDENT_AMBULATORY_CARE_PROVIDER_SITE_OTHER): Payer: BC Managed Care – PPO | Admitting: Psychology

## 2022-08-17 DIAGNOSIS — R10A0 Flank pain, unspecified side: Secondary | ICD-10-CM

## 2022-08-17 DIAGNOSIS — F411 Generalized anxiety disorder: Secondary | ICD-10-CM

## 2022-08-17 DIAGNOSIS — R109 Unspecified abdominal pain: Secondary | ICD-10-CM

## 2022-08-17 DIAGNOSIS — R319 Hematuria, unspecified: Secondary | ICD-10-CM

## 2022-08-17 NOTE — Progress Notes (Signed)
[facility] Counselor/Therapist Progress Note  Patient ID: Heather Wolfe, MRN: [id number],    Date: 08/17/2022  Time Spent: 11:00am-11:55am      55 minutes   Treatment Type: Individual Therapy  Reported Symptoms: stress, anxiety  Mental Status Exam: Appearance:  Casual     Behavior: Appropriate  Motor: Normal  Speech/Language:  Normal Rate  Affect: Appropriate  Mood: normal  Thought process: normal  Thought content:   WNL  Sensory/Perceptual disturbances:   WNL  Orientation: oriented to person, place, time/date, and situation  Attention: Good  Concentration: Good  Memory: WNL  Fund of knowledge:  Good  Insight:   Good  Judgment:  Good  Impulse Control: Good   Risk Assessment: Danger to Self:  No Self-injurious Behavior: No Danger to Others: No Duty to Warn:no Physical Aggression / Violence:No  Access to Firearms a concern: No  Gang Involvement:No   Subjective: Pt present for face-to-face individual therapy via Webex.   Pt consents to telehealth video therapy due to COVID 19 pandemic. Location of pt: home. Location of therapist: home office.   Pt talked about her health.   She was having symptoms of a UTI.   She went to the doctor and they thought she had kidney stones.  She is in a lot of pain.  She is going to get a CAT scan to further assess.  Addressed pt's health worries. Pt talked about job interviewing.  There are two jobs she is in the final stages of interviewing with that she may be interested in.   Addressed how pt tends to over commit her time which increases her stress.  Identified how pt can set limits on her commitments.  Worked on setting healthy boundaries.  Addressed how pt tends to "globalize" things and get into catastrophic thinking.   Worked on how pt can let things go that are beyond her control.   Pt talked about her relationship with [name].   They are doing well overall and planning on getting engaged in the near future.    Encouraged pt to increase self care.    Provided supportive therapy.    Interventions: Cognitive Behavioral Therapy and Insight-Oriented  Diagnosis:  F41.1  Plan:   Plan to continue to meet every two weeks.   Pt is progressing toward her treatment goals.    Treatment Plan   (Treatment Plan target date:  10/28/2022) Client Abilities/Strengths  Pt is bright, engaging and motivated for therapy.  Client Treatment Preferences  Individual therapy.  Client Statement of Needs  Improve coping skills.  Symptoms  Autonomic hyperactivity (e.g., palpitations, shortness of breath, dry mouth, trouble swallowing, nausea, diarrhea). Excessive and/or unrealistic worry that is difficult to control occurring more days than not for at least 6 months about a number of events or activities. Hypervigilance (e.g., feeling constantly on edge, experiencing concentration difficulties, having trouble falling or staying asleep, exhibiting a general state of irritability). Motor tension (e.g., restlessness, tiredness, shakiness, muscle tension). Problems Addressed  Anxiety Goals 1. Enhance ability to effectively cope with the full variety of life's worries and anxieties. 2. Learn and implement coping skills that result in a reduction of anxiety and worry, and improved daily functioning. Objective Learn to accept limitations in life and commit to tolerating, rather than avoiding, unpleasant emotions while accomplishing meaningful goals. Target Date: 2022-10-28 Frequency: Biweekly Progress: 30 Modality: individual Related Interventions 1. Use techniques from Acceptance and Commitment Therapy to help client accept uncomfortable realities such as lack of complete control,  imperfections, and uncertainty and tolerate unpleasant emotions and thoughts in order to accomplish value-consistent goals. Objective Learn and implement problem-solving strategies for realistically addressing worries. Target Date:  2022-10-28 Frequency: Biweekly Progress: 30 Modality: individual Related Interventions 1. Assign the client a homework exercise in which he/she problem-solves a current problem.  review, reinforce success, and provide corrective feedback toward improvement. 2. Teach the client problem-solving strategies involving specifically defining a problem, generating options for addressing it, evaluating the pros and cons of each option, selecting and implementing an optional action, and reevaluating and refining the action. Objective Learn and implement calming skills to reduce overall anxiety and manage anxiety symptoms. Target Date: 2022-10-28 Frequency: Biweekly Progress: 30 Modality: individual Related Interventions 1. Assign the client to read about progressive muscle relaxation and other calming strategies in relevant books or treatment manuals (e.g., Progressive Relaxation Training by Robb Matar and Alen Blew; Mastery of Your Anxiety and Worry: Workbook by Earlie Counts). 2. Assign the client homework each session in which he/she practices relaxation exercises daily, gradually applying them progressively from non-anxiety-provoking to anxiety-provoking situations; review and reinforce success while providing corrective feedback toward improvement. 3. Teach the client calming/relaxation skills (e.g., applied relaxation, progressive muscle relaxation, cue controlled relaxation; mindful breathing; biofeedback) and how to discriminate better between relaxation and tension; teach the client how to apply these skills to his/her daily life. 3. Reduce overall frequency, intensity, and duration of the anxiety so that daily functioning is not impaired. 4. Resolve the core conflict that is the source of anxiety. 5. Stabilize anxiety level while increasing ability to function on a daily basis. Diagnosis :    F41.1  Generalized Anxiety Disorder  Conditions For Discharge Achievement of treatment goals and  objectives.   Gissela Bloch, LCSW

## 2022-08-31 ENCOUNTER — Ambulatory Visit (INDEPENDENT_AMBULATORY_CARE_PROVIDER_SITE_OTHER): Payer: BC Managed Care – PPO | Admitting: Psychology

## 2022-08-31 DIAGNOSIS — F411 Generalized anxiety disorder: Secondary | ICD-10-CM | POA: Diagnosis not present

## 2022-08-31 NOTE — Progress Notes (Signed)
[facility] Counselor/Therapist Progress Note  Patient ID: Heather Wolfe, MRN: [id number],    Date: 08/31/2022  Time Spent: 11:00am-11:55am      55 minutes   Treatment Type: Individual Therapy  Reported Symptoms: stress, anxiety  Mental Status Exam: Appearance:  Casual     Behavior: Appropriate  Motor: Normal  Speech/Language:  Normal Rate  Affect: Appropriate  Mood: normal  Thought process: normal  Thought content:   WNL  Sensory/Perceptual disturbances:   WNL  Orientation: oriented to person, place, time/date, and situation  Attention: Good  Concentration: Good  Memory: WNL  Fund of knowledge:  Good  Insight:   Good  Judgment:  Good  Impulse Control: Good   Risk Assessment: Danger to Self:  No Self-injurious Behavior: No Danger to Others: No Duty to Warn:no Physical Aggression / Violence:No  Access to Firearms a concern: No  Gang Involvement:No   Subjective: Pt present for face-to-face individual therapy via Webex.   Pt consents to telehealth video therapy due to COVID 19 pandemic. Location of pt: home. Location of therapist: home office.   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  It is very stressful right now but she can see the light at the end of the tunnel.     Pt and [name] are planning a trip for Christmas.  Pt is looking forward to the trip but there is a lot of planning and work to prepare for the trip.   Pt talked about work.  She has been working a lot of hours and work has been very stressful.   Worked on Optician, dispensing.   Addressed how pt tends to over commit her time which increases her stress.  Identified how pt can set limits on her commitments.  Worked on setting healthy boundaries.  Addressed how pt tends to "globalize" things and get into catastrophic thinking.   Worked on how pt can let things go that are beyond her control.    Pt has had trouble sleeping.   Sent pt a  sleep diary to complete to collect data on her sleep patterns and assess what is impacting sleep disruption.   Encouraged pt to increase self care.    Provided supportive therapy.    Interventions: Cognitive Behavioral Therapy and Insight-Oriented  Diagnosis:  F41.1  Plan:   Plan to continue to meet every two weeks.   Pt is progressing toward her treatment goals.    Treatment Plan   (Treatment Plan target date:  10/28/2022) Client Abilities/Strengths  Pt is bright, engaging and motivated for therapy.  Client Treatment Preferences  Individual therapy.  Client Statement of Needs  Improve coping skills.  Symptoms  Autonomic hyperactivity (e.g., palpitations, shortness of breath, dry mouth, trouble swallowing, nausea, diarrhea). Excessive and/or unrealistic worry that is difficult to control occurring more days than not for at least 6 months about a number of events or activities. Hypervigilance (e.g., feeling constantly on edge, experiencing concentration difficulties, having trouble falling or staying asleep, exhibiting a general state of irritability). Motor tension (e.g., restlessness, tiredness, shakiness, muscle tension). Problems Addressed  Anxiety Goals 1. Enhance ability to effectively cope with the full variety of life's worries and anxieties. 2. Learn and implement coping skills that result in a reduction of anxiety and worry, and improved daily functioning. Objective Learn to accept limitations in life and commit to tolerating, rather than avoiding, unpleasant emotions while accomplishing meaningful goals. Target Date: 2022-10-28 Frequency: Biweekly  Progress: 30 Modality: individual Related Interventions 1. Use techniques from Acceptance and Commitment Therapy to help client accept uncomfortable realities such as lack of complete control, imperfections, and uncertainty and tolerate unpleasant emotions and thoughts in order to accomplish value-consistent goals. Objective Learn  and implement problem-solving strategies for realistically addressing worries. Target Date: 2022-10-28 Frequency: Biweekly Progress: 30 Modality: individual Related Interventions 1. Assign the client a homework exercise in which he/she problem-solves a current problem.  review, reinforce success, and provide corrective feedback toward improvement. 2. Teach the client problem-solving strategies involving specifically defining a problem, generating options for addressing it, evaluating the pros and cons of each option, selecting and implementing an optional action, and reevaluating and refining the action. Objective Learn and implement calming skills to reduce overall anxiety and manage anxiety symptoms. Target Date: 2022-10-28 Frequency: Biweekly Progress: 30 Modality: individual Related Interventions 1. Assign the client to read about progressive muscle relaxation and other calming strategies in relevant books or treatment manuals (e.g., Progressive Relaxation Training by Robb Matar and Alen Blew; Mastery of Your Anxiety and Worry: Workbook by Earlie Counts). 2. Assign the client homework each session in which he/she practices relaxation exercises daily, gradually applying them progressively from non-anxiety-provoking to anxiety-provoking situations; review and reinforce success while providing corrective feedback toward improvement. 3. Teach the client calming/relaxation skills (e.g., applied relaxation, progressive muscle relaxation, cue controlled relaxation; mindful breathing; biofeedback) and how to discriminate better between relaxation and tension; teach the client how to apply these skills to his/her daily life. 3. Reduce overall frequency, intensity, and duration of the anxiety so that daily functioning is not impaired. 4. Resolve the core conflict that is the source of anxiety. 5. Stabilize anxiety level while increasing ability to function on a daily basis. Diagnosis :    F41.1   Generalized Anxiety Disorder  Conditions For Discharge Achievement of treatment goals and objectives.   Yaffa Seckman, LCSW

## 2022-09-14 ENCOUNTER — Ambulatory Visit (INDEPENDENT_AMBULATORY_CARE_PROVIDER_SITE_OTHER): Payer: BC Managed Care – PPO | Admitting: Psychology

## 2022-09-14 DIAGNOSIS — F411 Generalized anxiety disorder: Secondary | ICD-10-CM | POA: Diagnosis not present

## 2022-09-14 NOTE — Progress Notes (Signed)
[facility] Counselor/Therapist Progress Note  Patient ID: Ricky Donahey, MRN: [id number],    Date: 09/14/2022  Time Spent: 4:00pm-4:55pm     55 minutes   Treatment Type: Individual Therapy  Reported Symptoms: stress, anxiety  Mental Status Exam: Appearance:  Casual     Behavior: Appropriate  Motor: Normal  Speech/Language:  Normal Rate  Affect: Appropriate  Mood: normal  Thought process: normal  Thought content:   WNL  Sensory/Perceptual disturbances:   WNL  Orientation: oriented to person, place, time/date, and situation  Attention: Good  Concentration: Good  Memory: WNL  Fund of knowledge:  Good  Insight:   Good  Judgment:  Good  Impulse Control: Good   Risk Assessment: Danger to Self:  No Self-injurious Behavior: No Danger to Others: No Duty to Warn:no Physical Aggression / Violence:No  Access to Firearms a concern: No  Gang Involvement:No   Subjective: Pt present for face-to-face individual therapy via Webex.   Pt consents to telehealth video therapy due to COVID 19 pandemic. Location of pt: home. Location of therapist: home office.   Pt talked about  Pt talked about work.  She has been working a lot of hours and work has been very stressful.   Worked on Child psychotherapist.   Pt has gotten a job offer that she is very interested in but is worried about the decision and scared of the unknown.   Helped pt process her thoughts and feelings.  Worked on Control and instrumentation engineer.   Addressed how pt tends to over commit her time which increases her stress.  Identified how pt can set limits on her commitments.  Worked on setting healthy boundaries.     Encouraged pt to increase self care.    Provided supportive therapy.    Interventions: Cognitive Behavioral Therapy and Insight-Oriented  Diagnosis:  F41.1  Plan:   Plan to continue to meet every two weeks.   Pt is progressing toward her treatment goals.    Treatment Plan   (Treatment Plan target date:   10/28/2022) Client Abilities/Strengths  Pt is bright, engaging and motivated for therapy.  Client Treatment Preferences  Individual therapy.  Client Statement of Needs  Improve coping skills.  Symptoms  Autonomic hyperactivity (e.g., palpitations, shortness of breath, dry mouth, trouble swallowing, nausea, diarrhea). Excessive and/or unrealistic worry that is difficult to control occurring more days than not for at least 6 months about a number of events or activities. Hypervigilance (e.g., feeling constantly on edge, experiencing concentration difficulties, having trouble falling or staying asleep, exhibiting a general state of irritability). Motor tension (e.g., restlessness, tiredness, shakiness, muscle tension). Problems Addressed  Anxiety Goals 1. Enhance ability to effectively cope with the full variety of life's worries and anxieties. 2. Learn and implement coping skills that result in a reduction of anxiety and worry, and improved daily functioning. Objective Learn to accept limitations in life and commit to tolerating, rather than avoiding, unpleasant emotions while accomplishing meaningful goals. Target Date: 2022-10-28 Frequency: Biweekly Progress: 30 Modality: individual Related Interventions 1. Use techniques from Acceptance and Commitment Therapy to help client accept uncomfortable realities such as lack of complete control, imperfections, and uncertainty and tolerate unpleasant emotions and thoughts in order to accomplish value-consistent goals. Objective Learn and implement problem-solving strategies for realistically addressing worries. Target Date: 2022-10-28 Frequency: Biweekly Progress: 30 Modality: individual Related Interventions 1. Assign the client a homework exercise in which he/she problem-solves a current problem.  review, reinforce success, and provide corrective feedback toward improvement. 2. Teach  the client problem-solving strategies involving specifically  defining a problem, generating options for addressing it, evaluating the pros and cons of each option, selecting and implementing an optional action, and reevaluating and refining the action. Objective Learn and implement calming skills to reduce overall anxiety and manage anxiety symptoms. Target Date: 2022-10-28 Frequency: Biweekly Progress: 30 Modality: individual Related Interventions 1. Assign the client to read about progressive muscle relaxation and other calming strategies in relevant books or treatment manuals (e.g., Progressive Relaxation Training by Robb Matar and Alen Blew; Mastery of Your Anxiety and Worry: Workbook by Earlie Counts). 2. Assign the client homework each session in which he/she practices relaxation exercises daily, gradually applying them progressively from non-anxiety-provoking to anxiety-provoking situations; review and reinforce success while providing corrective feedback toward improvement. 3. Teach the client calming/relaxation skills (e.g., applied relaxation, progressive muscle relaxation, cue controlled relaxation; mindful breathing; biofeedback) and how to discriminate better between relaxation and tension; teach the client how to apply these skills to his/her daily life. 3. Reduce overall frequency, intensity, and duration of the anxiety so that daily functioning is not impaired. 4. Resolve the core conflict that is the source of anxiety. 5. Stabilize anxiety level while increasing ability to function on a daily basis. Diagnosis :    F41.1  Generalized Anxiety Disorder  Conditions For Discharge Achievement of treatment goals and objectives.   Tariah Transue, LCSW

## 2022-10-06 ENCOUNTER — Ambulatory Visit (INDEPENDENT_AMBULATORY_CARE_PROVIDER_SITE_OTHER): Payer: BC Managed Care – PPO | Admitting: Psychology

## 2022-10-06 DIAGNOSIS — F411 Generalized anxiety disorder: Secondary | ICD-10-CM | POA: Diagnosis not present

## 2022-10-06 NOTE — Progress Notes (Signed)
[facility] Counselor/Therapist Progress Note  Patient ID: Demiana Crumbley, MRN: [id number],    Date: 10/06/2022  Time Spent: 11:00am-11:55am     55 minutes   Treatment Type: Individual Therapy  Reported Symptoms: stress, anxiety  Mental Status Exam: Appearance:  Casual     Behavior: Appropriate  Motor: Normal  Speech/Language:  Normal Rate  Affect: Appropriate  Mood: normal  Thought process: normal  Thought content:   WNL  Sensory/Perceptual disturbances:   WNL  Orientation: oriented to person, place, time/date, and situation  Attention: Good  Concentration: Good  Memory: WNL  Fund of knowledge:  Good  Insight:   Good  Judgment:  Good  Impulse Control: Good   Risk Assessment: Danger to Self:  No Self-injurious Behavior: No Danger to Others: No Duty to Warn:no Physical Aggression / Violence:No  Access to Firearms a concern: No  Gang Involvement:No   Subjective: Pt present for face-to-face individual therapy via Webex.   Pt consents to telehealth video therapy due to COVID 19 pandemic. Location of pt: home. Location of therapist: home office.   Pt talked about getting engaged.  She and Kirk Ruths are very excited but the wedding planning stress has already started.  There is pressure from family.  Addressed the issues and how she and Kirk Ruths are coping with them.   They are on the same page and supporting each other.  Pt tends to feel pressure from people's opinions about what she should do.   Helped pt process her feelings and thoughts.   Pt talked about work.  It has been very stressful.   Worked on Child psychotherapist.  Addressed how pt tends to over commit her time which increases her stress.  Identified how pt can set limits on her commitments.  Worked on setting healthy boundaries.     Encouraged pt to increase self care.    Provided supportive therapy.    Interventions: Cognitive Behavioral Therapy and Insight-Oriented  Diagnosis:  F41.1  Plan:    Plan to continue to meet every two weeks.   Pt is progressing toward her treatment goals.    Treatment Plan   (Treatment Plan target date:  10/28/2022) Client Abilities/Strengths  Pt is bright, engaging and motivated for therapy.  Client Treatment Preferences  Individual therapy.  Client Statement of Needs  Improve coping skills.  Symptoms  Autonomic hyperactivity (e.g., palpitations, shortness of breath, dry mouth, trouble swallowing, nausea, diarrhea). Excessive and/or unrealistic worry that is difficult to control occurring more days than not for at least 6 months about a number of events or activities. Hypervigilance (e.g., feeling constantly on edge, experiencing concentration difficulties, having trouble falling or staying asleep, exhibiting a general state of irritability). Motor tension (e.g., restlessness, tiredness, shakiness, muscle tension). Problems Addressed  Anxiety Goals 1. Enhance ability to effectively cope with the full variety of life's worries and anxieties. 2. Learn and implement coping skills that result in a reduction of anxiety and worry, and improved daily functioning. Objective Learn to accept limitations in life and commit to tolerating, rather than avoiding, unpleasant emotions while accomplishing meaningful goals. Target Date: 2022-10-28 Frequency: Biweekly Progress: 30 Modality: individual Related Interventions 1. Use techniques from Acceptance and Commitment Therapy to help client accept uncomfortable realities such as lack of complete control, imperfections, and uncertainty and tolerate unpleasant emotions and thoughts in order to accomplish value-consistent goals. Objective Learn and implement problem-solving strategies for realistically addressing worries. Target Date: 2022-10-28 Frequency: Biweekly Progress: 30 Modality: individual Related Interventions 1. Assign the  client a homework exercise in which he/she problem-solves a current problem.  review,  reinforce success, and provide corrective feedback toward improvement. 2. Teach the client problem-solving strategies involving specifically defining a problem, generating options for addressing it, evaluating the pros and cons of each option, selecting and implementing an optional action, and reevaluating and refining the action. Objective Learn and implement calming skills to reduce overall anxiety and manage anxiety symptoms. Target Date: 2022-10-28 Frequency: Biweekly Progress: 30 Modality: individual Related Interventions 1. Assign the client to read about progressive muscle relaxation and other calming strategies in relevant books or treatment manuals (e.g., Progressive Relaxation Training by Robb Matar and Alen Blew; Mastery of Your Anxiety and Worry: Workbook by Earlie Counts). 2. Assign the client homework each session in which he/she practices relaxation exercises daily, gradually applying them progressively from non-anxiety-provoking to anxiety-provoking situations; review and reinforce success while providing corrective feedback toward improvement. 3. Teach the client calming/relaxation skills (e.g., applied relaxation, progressive muscle relaxation, cue controlled relaxation; mindful breathing; biofeedback) and how to discriminate better between relaxation and tension; teach the client how to apply these skills to his/her daily life. 3. Reduce overall frequency, intensity, and duration of the anxiety so that daily functioning is not impaired. 4. Resolve the core conflict that is the source of anxiety. 5. Stabilize anxiety level while increasing ability to function on a daily basis. Diagnosis :    F41.1  Generalized Anxiety Disorder  Conditions For Discharge Achievement of treatment goals and objectives.   Jabarri Stefanelli, LCSW

## 2022-10-19 ENCOUNTER — Ambulatory Visit (INDEPENDENT_AMBULATORY_CARE_PROVIDER_SITE_OTHER): Payer: BC Managed Care – PPO | Admitting: Psychology

## 2022-10-19 DIAGNOSIS — F411 Generalized anxiety disorder: Secondary | ICD-10-CM | POA: Diagnosis not present

## 2022-10-19 NOTE — Progress Notes (Signed)
[facility] Counselor/Therapist Progress Note  Patient ID: Heather Wolfe, MRN: [id number],    Date: 10/19/2022  Time Spent: 11:00am-11:55am     55 minutes   Treatment Type: Individual Therapy  Reported Symptoms: stress, anxiety  Mental Status Exam: Appearance:  Casual     Behavior: Appropriate  Motor: Normal  Speech/Language:  Normal Rate  Affect: Appropriate  Mood: normal  Thought process: normal  Thought content:   WNL  Sensory/Perceptual disturbances:   WNL  Orientation: oriented to person, place, time/date, and situation  Attention: Good  Concentration: Good  Memory: WNL  Fund of knowledge:  Good  Insight:   Good  Judgment:  Good  Impulse Control: Good   Risk Assessment: Danger to Self:  No Self-injurious Behavior: No Danger to Others: No Duty to Warn:no Physical Aggression / Violence:No  Access to Firearms a concern: No  Gang Involvement:No   Subjective: Pt present for face-to-face individual therapy via Webex.   Pt consents to telehealth video therapy due to COVID 19 pandemic. Location of pt: home. Location of therapist: home office.   Pt talked about wedding planning.  She and Kirk Ruths chose a venue in the [place] and scheduled for June of 2025.   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   Pt is worried about her family who wishes pt would get married in 2024.   Pt is trying to not second guess herself.   Helped pt process her feelings and thoughts.   Pt has not been sleeping well and has been dreaming about wedding worries.  Worked on worry management.   Pt talked about work.  It has been very stressful.   Worked on Child psychotherapist.  Pt talked about having anxiety about hosting social events even though she loves being social.   Pt states she is a people pleaser and puts a lot of pressure on herself to please everyone.  Helped pt process her feelings.   Worked on setting healthy boundaries.     Encouraged pt to increase  self care.    Provided supportive therapy.    Interventions: Cognitive Behavioral Therapy and Insight-Oriented  Diagnosis:  F41.1  Plan:   Plan to continue to meet every two weeks.   Pt is progressing toward her treatment goals.    Treatment Plan   (Treatment Plan target date:  10/28/2022) Client Abilities/Strengths  Pt is bright, engaging and motivated for therapy.  Client Treatment Preferences  Individual therapy.  Client Statement of Needs  Improve coping skills.  Symptoms  Autonomic hyperactivity (e.g., palpitations, shortness of breath, dry mouth, trouble swallowing, nausea, diarrhea). Excessive and/or unrealistic worry that is difficult to control occurring more days than not for at least 6 months about a number of events or activities. Hypervigilance (e.g., feeling constantly on edge, experiencing concentration difficulties, having trouble falling or staying asleep, exhibiting a general state of irritability). Motor tension (e.g., restlessness, tiredness, shakiness, muscle tension). Problems Addressed  Anxiety Goals 1. Enhance ability to effectively cope with the full variety of life's worries and anxieties. 2. Learn and implement coping skills that result in a reduction of anxiety and worry, and improved daily functioning. Objective Learn to accept limitations in life and commit to tolerating, rather than avoiding, unpleasant emotions while accomplishing meaningful goals. Target Date: 2022-10-28 Frequency: Biweekly Progress: 30 Modality: individual Related Interventions 1. Use techniques from Acceptance and Commitment Therapy to help client accept uncomfortable realities such as lack of complete control, imperfections, and uncertainty and tolerate  unpleasant emotions and thoughts in order to accomplish value-consistent goals. Objective Learn and implement problem-solving strategies for realistically addressing worries. Target Date: 2022-10-28 Frequency: Biweekly Progress:  30 Modality: individual Related Interventions 1. Assign the client a homework exercise in which he/she problem-solves a current problem.  review, reinforce success, and provide corrective feedback toward improvement. 2. Teach the client problem-solving strategies involving specifically defining a problem, generating options for addressing it, evaluating the pros and cons of each option, selecting and implementing an optional action, and reevaluating and refining the action. Objective Learn and implement calming skills to reduce overall anxiety and manage anxiety symptoms. Target Date: 2022-10-28 Frequency: Biweekly Progress: 30 Modality: individual Related Interventions 1. Assign the client to read about progressive muscle relaxation and other calming strategies in relevant books or treatment manuals (e.g., Progressive Relaxation Training by Gwynneth Aliment and Dani Gobble; Mastery of Your Anxiety and Worry: Workbook by Beckie Busing). 2. Assign the client homework each session in which he/she practices relaxation exercises daily, gradually applying them progressively from non-anxiety-provoking to anxiety-provoking situations; review and reinforce success while providing corrective feedback toward improvement. 3. Teach the client calming/relaxation skills (e.g., applied relaxation, progressive muscle relaxation, cue controlled relaxation; mindful breathing; biofeedback) and how to discriminate better between relaxation and tension; teach the client how to apply these skills to his/her daily life. 3. Reduce overall frequency, intensity, and duration of the anxiety so that daily functioning is not impaired. 4. Resolve the core conflict that is the source of anxiety. 5. Stabilize anxiety level while increasing ability to function on a daily basis. Diagnosis :    F41.1  Generalized Anxiety Disorder  Conditions For Discharge Achievement of treatment goals and objectives.   Makinze Jani, LCSW

## 2022-11-02 ENCOUNTER — Ambulatory Visit: Payer: BC Managed Care – PPO | Admitting: Psychology

## 2022-11-16 ENCOUNTER — Ambulatory Visit: Payer: BC Managed Care – PPO | Admitting: Psychology

## 2022-11-30 ENCOUNTER — Ambulatory Visit: Payer: BC Managed Care – PPO | Admitting: Psychology

## 2022-12-21 ENCOUNTER — Ambulatory Visit: Payer: BC Managed Care – PPO | Admitting: Psychology

## 2023-01-04 ENCOUNTER — Ambulatory Visit: Payer: BC Managed Care – PPO | Admitting: Psychology

## 2023-01-11 ENCOUNTER — Other Ambulatory Visit: Payer: Self-pay

## 2023-01-11 DIAGNOSIS — Z789 Other specified health status: Secondary | ICD-10-CM

## 2023-01-11 MED ORDER — SLYND 4 MG PO TABS: 1.0000 | ORAL_TABLET | Freq: Every day | ORAL | 0 refills | Status: DC

## 2023-01-22 ENCOUNTER — Ambulatory Visit (INDEPENDENT_AMBULATORY_CARE_PROVIDER_SITE_OTHER): Payer: BC Managed Care – PPO | Admitting: Obstetrics and Gynecology

## 2023-01-22 ENCOUNTER — Other Ambulatory Visit (HOSPITAL_COMMUNITY)
Admission: RE | Admit: 2023-01-22 | Discharge: 2023-01-22 | Disposition: A | Discharge status: Home / Self Care | Payer: BC Managed Care – PPO | Source: Ambulatory Visit | Attending: Obstetrics and Gynecology | Admitting: Obstetrics and Gynecology

## 2023-01-22 ENCOUNTER — Encounter: Payer: Self-pay | Admitting: Obstetrics and Gynecology

## 2023-01-22 VITALS — BP 119/68 | HR 80 | Ht 64.0 in | Wt 140.0 lb

## 2023-01-22 DIAGNOSIS — Z113 Encounter for screening for infections with a predominantly sexual mode of transmission: Secondary | ICD-10-CM

## 2023-01-22 DIAGNOSIS — Z789 Other specified health status: Secondary | ICD-10-CM

## 2023-01-22 DIAGNOSIS — Z01419 Encounter for gynecological examination (general) (routine) without abnormal findings: Secondary | ICD-10-CM

## 2023-01-22 MED ORDER — SLYND 4 MG PO TABS: 1.0000 | ORAL_TABLET | Freq: Every day | ORAL | 0 refills | Status: DC

## 2023-01-22 NOTE — Progress Notes (Signed)
Last pap- 11/21/21- negative 

## 2023-01-22 NOTE — Addendum Note (Signed)
Addended by: Kathie Dike on: 01/22/2023 09:30 AM   Modules accepted: Orders

## 2023-01-22 NOTE — Progress Notes (Signed)
GYNECOLOGY ANNUAL PREVENTATIVE CARE ENCOUNTER NOTE  History:     Heather Wolfe is a 27 y.o. G0P0000 female here for a routine annual gynecologic exam.  Current complaints: none.   Denies abnormal vaginal bleeding, discharge, pelvic pain, problems with intercourse or other gynecologic concerns.  Recently engaged.   Gynecologic History No LMP recorded. (Menstrual status: Oral contraceptives). Contraception: oral progesterone-only contraceptive Last Pap: 2023. Result was normal with negative HPV   Obstetric History OB History  Gravida Para Term Preterm AB Living  0 0 0 0 0 0  SAB IAB Ectopic Multiple Live Births  0 0 0 0 0    Past Medical History:  Diagnosis Date   Anxiety    Depression     Past Surgical History:  Procedure Laterality Date   NO PAST SURGERIES      Current Outpatient Medications on File Prior to Visit  Medication Sig Dispense Refill   Drospirenone (SLYND) 4 MG TABS Take 1 tablet (4 mg total) by mouth daily at 6 (six) AM. 84 tablet 0   No current facility-administered medications on file prior to visit.    Allergies  Allergen Reactions   Sulfa Antibiotics Hives    Fever, chills   Sulfamethoxazole-Trimethoprim Hives, Other (See Comments) and Rash    Fever, chills    Social History:  reports that she has never smoked. She has never used smokeless tobacco. She reports current alcohol use of about 1.0 - 2.0 standard drink of alcohol per week. She reports that she does not use drugs.  Family History  Problem Relation Age of Onset   Diabetes Mother    Hypertension Mother    Breast cancer Paternal Aunt    Diabetes Maternal Grandfather    Diabetes Paternal Grandmother     The following portions of the patient's history were reviewed and updated as appropriate: allergies, current medications, past family history, past medical history, past social history, past surgical history and problem list.  Review of Systems Pertinent items noted in HPI  and remainder of comprehensive ROS otherwise negative.  Physical Exam:  BP 119/68   Pulse 80   Ht 5\' 4"  (1.626 m)   Wt 140 lb (63.5 kg)   BMI 24.03 kg/m  CONSTITUTIONAL: Well-developed, well-nourished female in no acute distress.  HENT:  Normocephalic, atraumatic, External right and left ear normal.  EYES: Conjunctivae and EOM are normal. Pupils are equal, round, and reactive to light. No scleral icterus.  NECK: Normal range of motion, supple, no masses.  Normal thyroid.  SKIN: Skin is warm and dry. No rash noted. Not diaphoretic. No erythema. No pallor. MUSCULOSKELETAL: Normal range of motion. No tenderness.  No cyanosis, clubbing, or edema. NEUROLOGIC: Alert and oriented to person, place, and time. Normal reflexes, muscle tone coordination.  PSYCHIATRIC: Normal mood and affect. Normal behavior. Normal judgment and thought content. CARDIOVASCULAR: Normal heart rate noted, regular rhythm RESPIRATORY: Clear to auscultation bilaterally. Effort and breath sounds normal, no problems with respiration noted. BREASTS: Symmetric in size. No masses, tenderness, skin changes, nipple drainage, or lymphadenopathy bilaterally. Performed in the presence of a chaperone. ABDOMEN: Soft, no distention noted.  No tenderness, rebound or guarding.  PELVIC: Normal appearing external genitalia and urethral meatus.  Pap not obtained.  Wet prep collected. Performed in the presence of a chaperone.   Assessment and Plan:   1. Uses birth control  - Drospirenone (SLYND) 4 MG TABS; Take 1 tablet (4 mg total) by mouth daily at 6 (six) AM.  Dispense: 84 tablet; Refill: 0  Samples provided.   2. Women's annual routine gynecological examination  Pap not due Wet prep collected.    Burdell Peed, Harolyn Rutherford, NP [facility] [employer], [facility]

## 2023-01-25 LAB — CERVICOVAGINAL ANCILLARY ONLY
Chlamydia: NEGATIVE
Comment: NEGATIVE
Comment: NEGATIVE
Comment: NORMAL
Neisseria Gonorrhea: NEGATIVE
Trichomonas: NEGATIVE

## 2023-06-01 ENCOUNTER — Telehealth: Payer: Self-pay | Admitting: *Deleted

## 2023-06-01 DIAGNOSIS — Z789 Other specified health status: Secondary | ICD-10-CM

## 2023-06-01 MED ORDER — SLYND 4 MG PO TABS: 1.0000 | ORAL_TABLET | Freq: Every day | ORAL | 3 refills | Status: DC

## 2023-06-01 NOTE — Telephone Encounter (Signed)
Pt request that her Slynd now be sent to [facility].  RX changed to [facility].

## 2023-06-03 ENCOUNTER — Encounter: Payer: Self-pay | Admitting: *Deleted

## 2023-06-15 IMAGING — US US BREAST*R* LIMITED INC AXILLA
1 series · 7 of 7 positions shown · non-contrast
Comparison: None.

CLINICAL DATA: Palpable lump in the right breast

EXAM:
ULTRASOUND OF THE RIGHT BREAST

[Series 1: us breast*right* limited inc axilla · 0.06mm/px · 7 acquisitions, 7 frames shown]
[im 1/7]
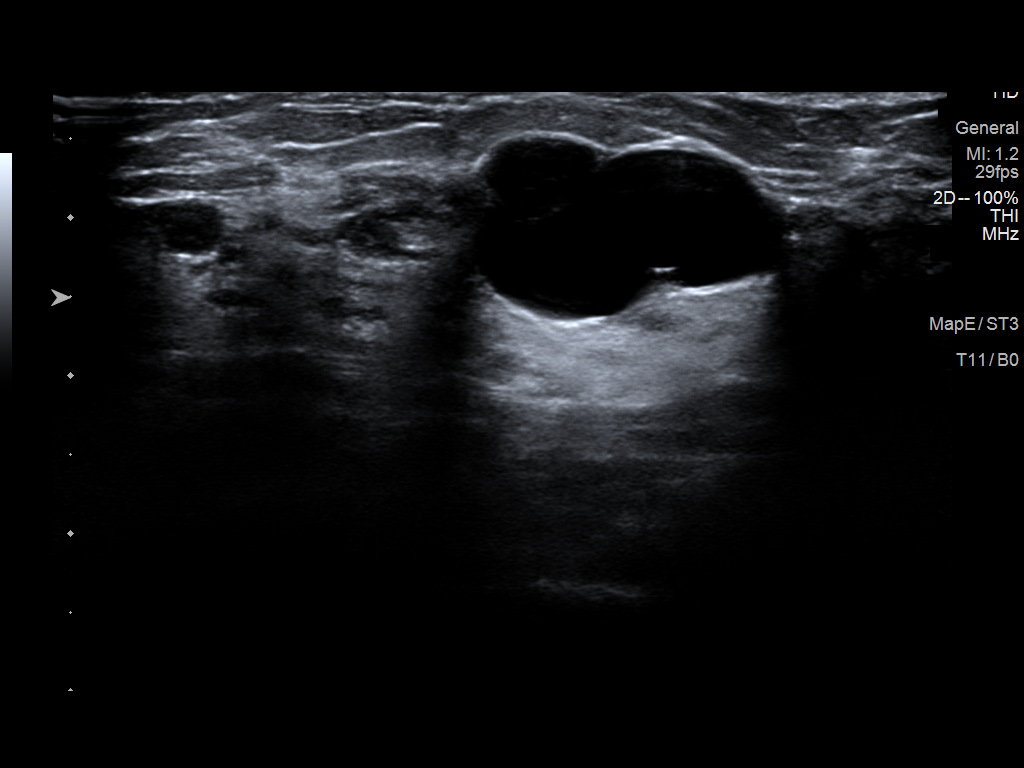
[im 2/7]
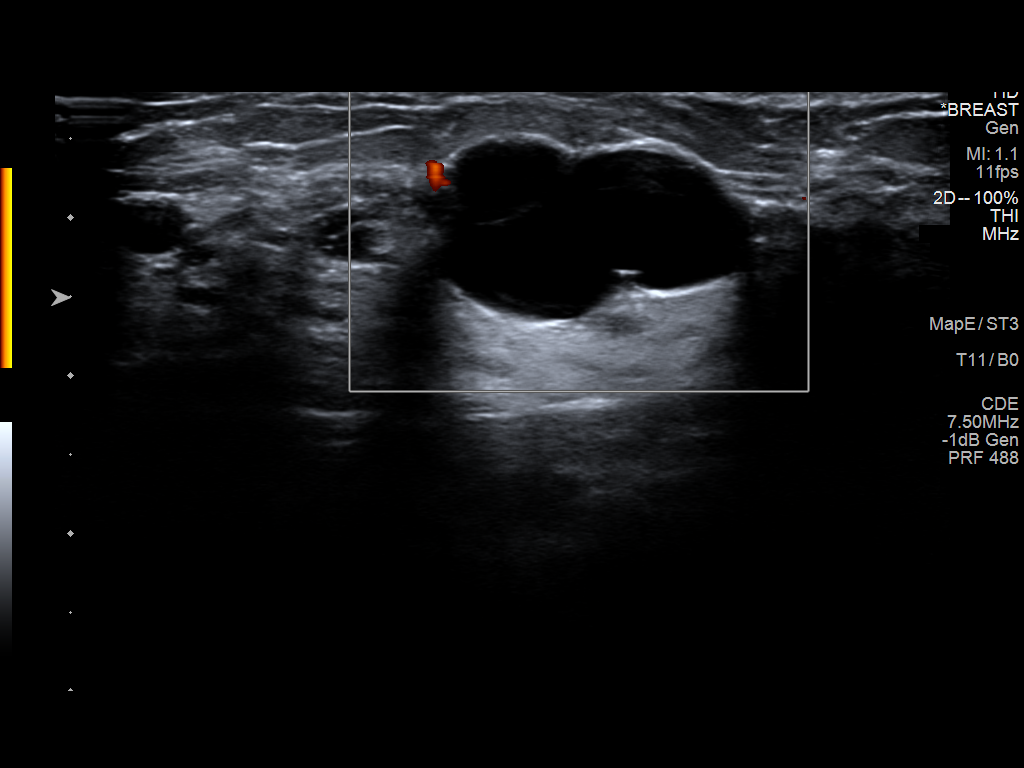
[im 3/7]
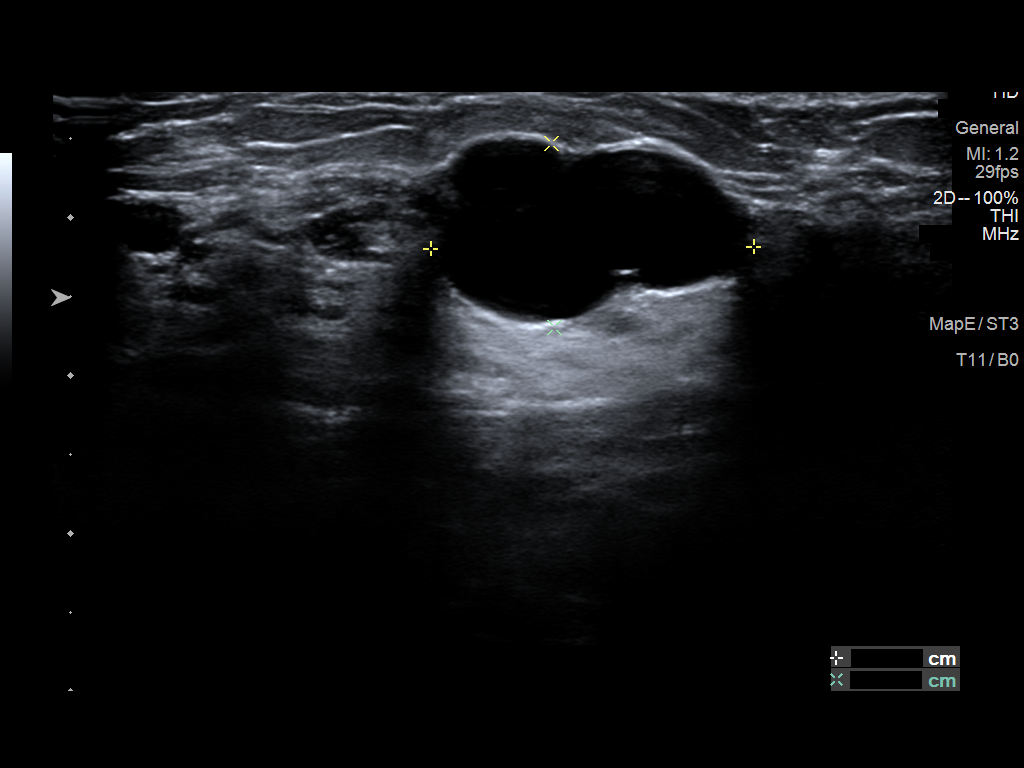
[im 4/7]
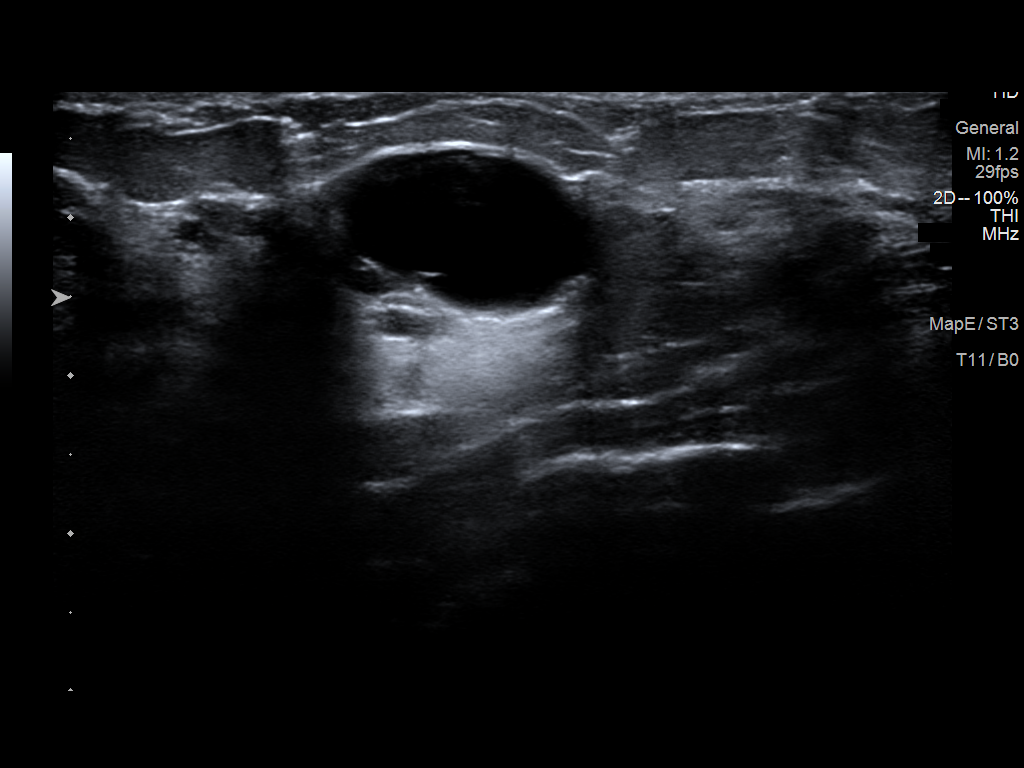
[im 5/7]
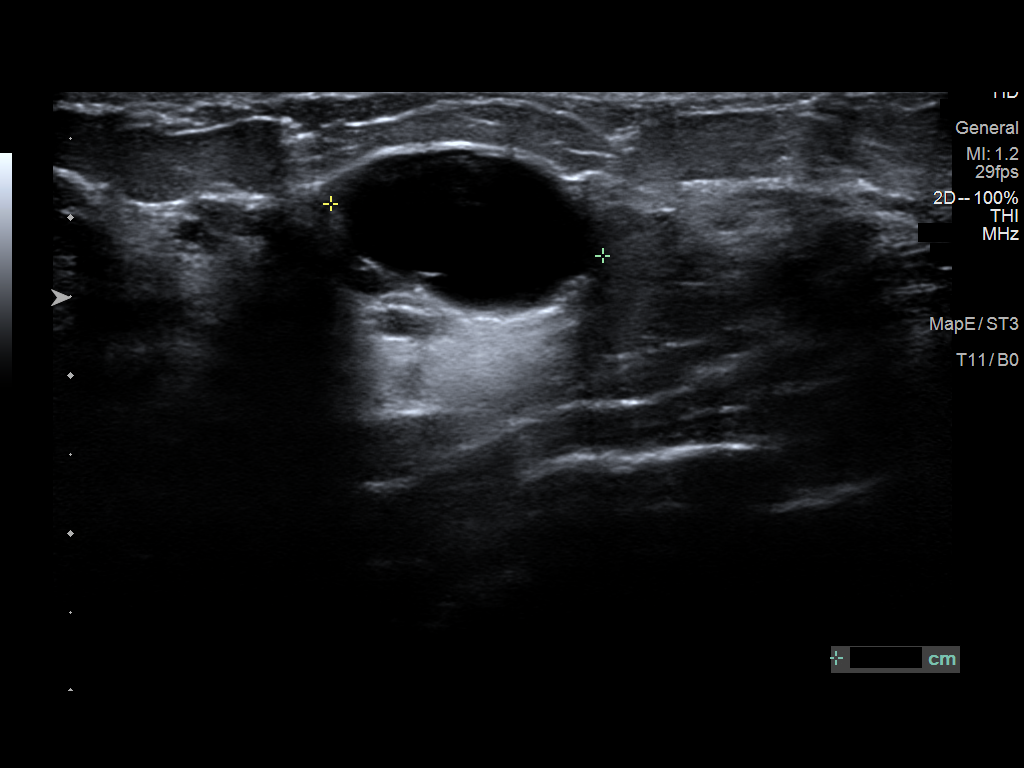
[im 6/7]
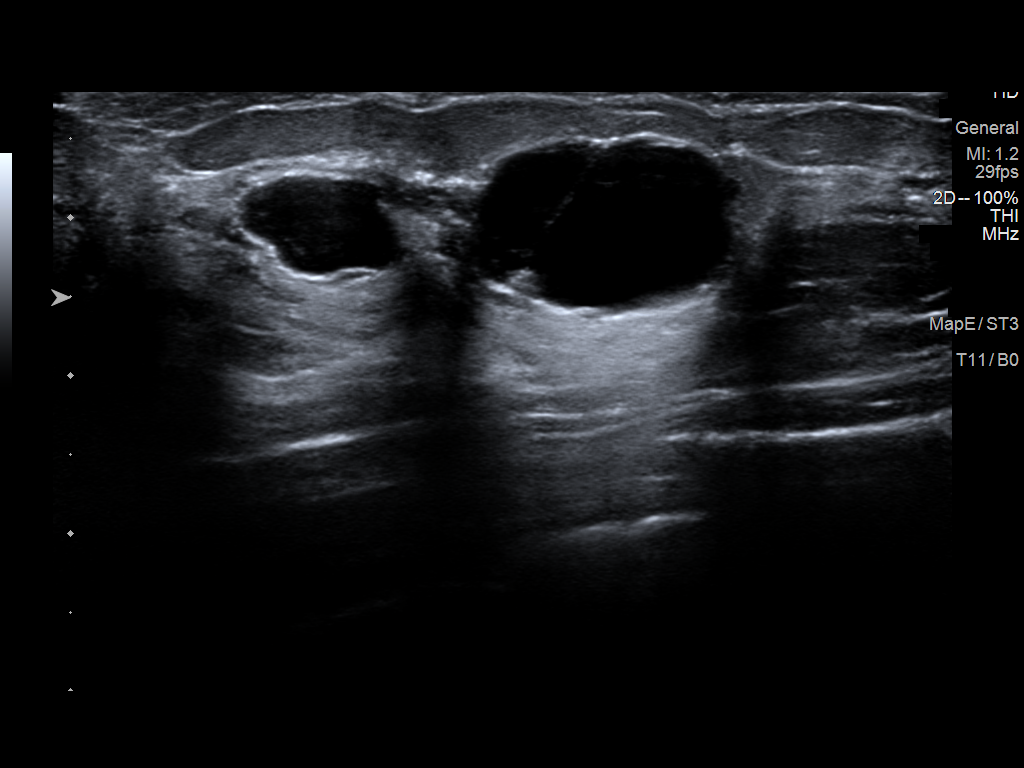
[im 7/7]
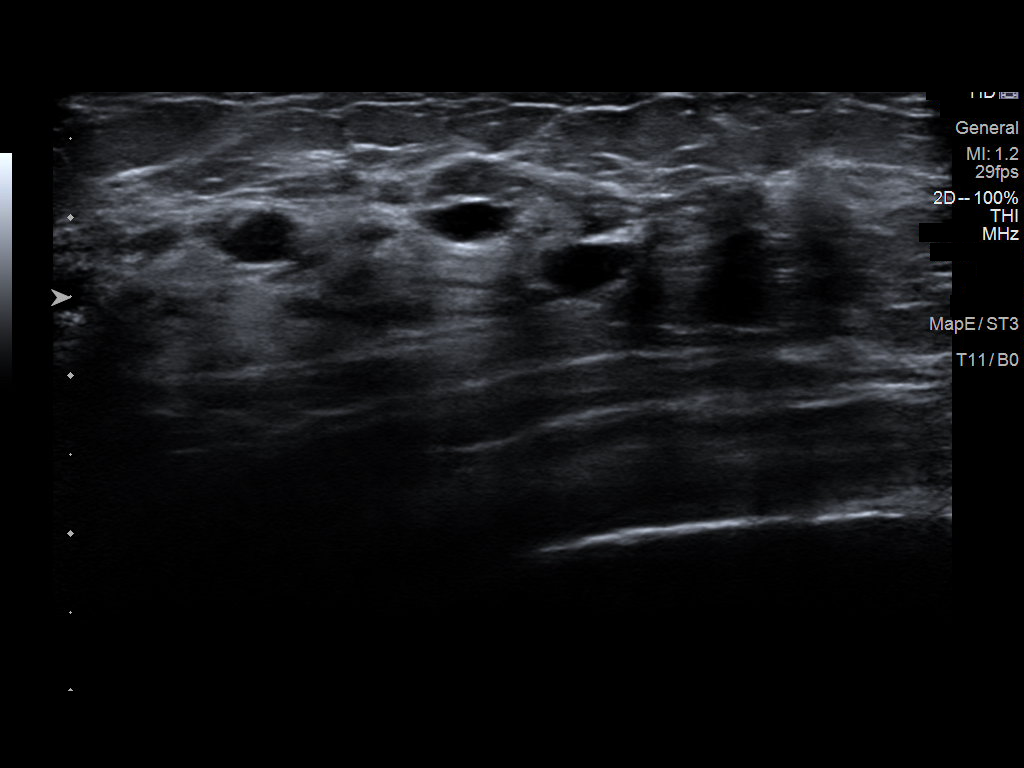

[7 of 7 positions shown; findings below may reference images not displayed]

FINDINGS: Targeted ultrasound is performed, showing numerous simple and
complicated cysts throughout the right breast accounting for the
patient's palpable lump. The patient is palpating a dominant mildly
complicated cyst at 6 o'clock.
IMPRESSION: Fibrocystic changes.  No evidence of malignancy.

RECOMMENDATION:
The patient states her aunt had breast cancer in her 40s. If the
patient's aunt was 45 years of age or younger, genetic counseling
should be pursued. If the patient is not found to be high risk or
genetically positive, recommend annual screening mammography 10
years earlier than the age her aunt was diagnosed. If the patient is
found to be high risk, breast MRI would also be necessary.

I have discussed the findings and recommendations with the patient.
If applicable, a reminder letter will be sent to the patient
regarding the next appointment.

BI-RADS CATEGORY  2: Benign.

## 2023-07-28 DIAGNOSIS — R197 Diarrhea, unspecified: Secondary | ICD-10-CM | POA: Diagnosis not present

## 2023-08-12 DIAGNOSIS — R197 Diarrhea, unspecified: Secondary | ICD-10-CM | POA: Diagnosis not present

## 2023-08-17 DIAGNOSIS — R197 Diarrhea, unspecified: Secondary | ICD-10-CM | POA: Diagnosis not present

## 2023-09-17 DIAGNOSIS — R197 Diarrhea, unspecified: Secondary | ICD-10-CM | POA: Diagnosis not present

## 2023-11-15 DIAGNOSIS — R197 Diarrhea, unspecified: Secondary | ICD-10-CM | POA: Diagnosis not present

## 2023-11-15 DIAGNOSIS — K2970 Gastritis, unspecified, without bleeding: Secondary | ICD-10-CM | POA: Diagnosis not present

## 2023-12-03 ENCOUNTER — Telehealth: Payer: Self-pay | Admitting: *Deleted

## 2023-12-03 NOTE — Telephone Encounter (Signed)
Left patient a message to call and schedule annual. 

## 2024-02-04 ENCOUNTER — Encounter: Payer: Self-pay | Admitting: Obstetrics and Gynecology

## 2024-02-04 ENCOUNTER — Ambulatory Visit: Admitting: Obstetrics and Gynecology

## 2024-02-04 DIAGNOSIS — Z789 Other specified health status: Secondary | ICD-10-CM

## 2024-02-04 DIAGNOSIS — Z3041 Encounter for surveillance of contraceptive pills: Secondary | ICD-10-CM | POA: Diagnosis not present

## 2024-02-04 DIAGNOSIS — Z01419 Encounter for gynecological examination (general) (routine) without abnormal findings: Secondary | ICD-10-CM | POA: Diagnosis not present

## 2024-02-04 DIAGNOSIS — K589 Irritable bowel syndrome without diarrhea: Secondary | ICD-10-CM | POA: Insufficient documentation

## 2024-02-04 MED ORDER — SLYND 4 MG PO TABS: 1.0000 | ORAL_TABLET | Freq: Every day | ORAL | 3 refills | Status: AC

## 2024-02-04 NOTE — Progress Notes (Signed)
 GYNECOLOGY ANNUAL PREVENTATIVE CARE ENCOUNTER NOTE  History:      Heather Wolfe is a 28 y.o. G0P0000 female here for a routine annual gynecologic exam.  Current complaints: None.   Denies abnormal vaginal bleeding, discharge, pelvic pain, problems with intercourse or other gynecologic concerns.  Getting married in July. Planning for a pregnancy shortly after.    Gynecologic History No LMP recorded (lmp unknown). (Menstrual status: Oral contraceptives). Contraception: oral progesterone-only contraceptive Last Pap: 2023. Result was normal with negative HPV  Obstetric History OB History  Gravida Para Term Preterm AB Living  0 0 0 0 0 0  SAB IAB Ectopic Multiple Live Births  0 0 0 0 0    Past Medical History:  Diagnosis Date   Anxiety    Depression    IBS (irritable bowel syndrome)     Past Surgical History:  Procedure Laterality Date   COLONOSCOPY     NO PAST SURGERIES     UPPER GI ENDOSCOPY      Current Outpatient Medications on File Prior to Visit  Medication Sig Dispense Refill   Drospirenone  (SLYND ) 4 MG TABS Take 1 tablet (4 mg total) by mouth daily at 6 (six) AM. 84 tablet 3   No current facility-administered medications on file prior to visit.    Allergies  Allergen Reactions   Sulfa Antibiotics Hives    Fever, chills   Sulfamethoxazole-Trimethoprim Hives, Other (See Comments) and Rash    Fever, chills    Social History:  reports that she has never smoked. She has never used smokeless tobacco. She reports current alcohol use of about 1.0 - 2.0 standard drink of alcohol per week. She reports that she does not use drugs.  Family History  Problem Relation Age of Onset   Diabetes Mother    Hypertension Mother    Breast cancer Paternal Aunt    Diabetes Maternal Grandfather    Diabetes Paternal Grandmother     The following portions of the patient's history were reviewed and updated as appropriate: allergies, current medications, past family  history, past medical history, past social history, past surgical history and problem list.  Review of Systems Pertinent items noted in HPI and remainder of comprehensive ROS otherwise negative.  Physical Exam:  BP 119/76   Pulse 76   Ht 5\' 5"  (1.651 m)   Wt 141 lb (64 kg)   LMP  (LMP Unknown)   BMI 23.46 kg/m  CONSTITUTIONAL: Well-developed, well-nourished female in no acute distress.  HENT:  Normocephalic, atraumatic, External right and left ear normal.  EYES: Conjunctivae and EOM are normal. Pupils are equal, round, and reactive to light. No scleral icterus.  NECK: Normal range of motion, supple, no masses observed. SKIN: Skin is warm and dry. No rash noted. Not diaphoretic. No erythema. No pallor. MUSCULOSKELETAL: Normal range of motion. No tenderness.  No cyanosis, clubbing, or edema. NEUROLOGIC: Alert and oriented to person, place, and time. Normal muscle tone coordination.  PSYCHIATRIC: Normal mood and affect. Normal behavior. Normal judgment and thought content. CARDIOVASCULAR: Normal heart rate noted, regular rhythm RESPIRATORY: Clear to auscultation bilaterally. Effort and breath sounds normal, no problems with respiration noted. BREASTS: Symmetric in size. No masses, tenderness, skin changes, nipple drainage, or lymphadenopathy bilaterally. Performed in the presence of a chaperone. ABDOMEN: Soft, no distention noted.  No tenderness, rebound or guarding.  PELVIC: Deferred    Assessment and Plan:   1. Uses birth control  - Drospirenone  (SLYND ) 4 MG TABS; Take 1  tablet (4 mg total) by mouth daily at 6 (six) AM.  Dispense: 84 tablet; Refill: 3   - Start prenatal vitamin and folic acid 1 mg. - Declined STD testing.      Normal breast examination today, she was advised to perform periodic self breast examinations.  Routine preventative health maintenance measures emphasized. Please refer to After Visit Summary for other counseling recommendations.    Kelin Borum, Juliette Oh, NP [facility] [employer], [facility]

## 2024-02-04 NOTE — Progress Notes (Signed)
 Discuss BCM. Thinks wants pregnancy next year.
